# Patient Record
Sex: Male | Born: 2000 | Race: Black or African American | Hispanic: No | Marital: Single | Smoking: Never smoker
Health system: Southern US, Community
[De-identification: ages and names within clinical notes are randomized; demographics above are authoritative.]

## PROBLEM LIST (undated history)

## (undated) DIAGNOSIS — F909 Attention-deficit hyperactivity disorder, unspecified type: Secondary | ICD-10-CM

## (undated) DIAGNOSIS — Z8489 Family history of other specified conditions: Secondary | ICD-10-CM

## (undated) DIAGNOSIS — W3400XA Accidental discharge from unspecified firearms or gun, initial encounter: Secondary | ICD-10-CM

## (undated) DIAGNOSIS — S0990XA Unspecified injury of head, initial encounter: Secondary | ICD-10-CM

---
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
## 2019-07-19 ENCOUNTER — Ambulatory Visit: Payer: Medicaid Other

## 2019-11-09 ENCOUNTER — Other Ambulatory Visit: Payer: Self-pay

## 2019-11-09 DIAGNOSIS — Z20822 Contact with and (suspected) exposure to covid-19: Secondary | ICD-10-CM

## 2019-11-10 LAB — NOVEL CORONAVIRUS, NAA: SARS-CoV-2, NAA: NOT DETECTED

## 2020-01-06 ENCOUNTER — Encounter (HOSPITAL_COMMUNITY): Payer: Self-pay | Admitting: Emergency Medicine

## 2020-01-06 ENCOUNTER — Emergency Department (HOSPITAL_COMMUNITY)
Admission: EM | Admit: 2020-01-06 | Discharge: 2020-01-06 | Disposition: A | Discharge status: Home / Self Care | Payer: Medicaid Other | Attending: Emergency Medicine | Admitting: Emergency Medicine

## 2020-01-06 ENCOUNTER — Other Ambulatory Visit: Payer: Self-pay

## 2020-01-06 DIAGNOSIS — Z5321 Procedure and treatment not carried out due to patient leaving prior to being seen by health care provider: Secondary | ICD-10-CM | POA: Diagnosis not present

## 2020-01-06 DIAGNOSIS — K6289 Other specified diseases of anus and rectum: Secondary | ICD-10-CM | POA: Diagnosis present

## 2020-01-06 NOTE — ED Triage Notes (Signed)
Patient called for room, no answer. 

## 2020-01-06 NOTE — ED Triage Notes (Signed)
Pt reports abscess to perianal region. Pt denies n/v/fever.

## 2020-09-18 ENCOUNTER — Ambulatory Visit: Payer: Medicaid Other | Admitting: Family Medicine

## 2020-09-18 ENCOUNTER — Encounter: Payer: Self-pay | Admitting: Family Medicine

## 2020-09-18 ENCOUNTER — Other Ambulatory Visit: Payer: Self-pay

## 2020-09-18 DIAGNOSIS — Z113 Encounter for screening for infections with a predominantly sexual mode of transmission: Secondary | ICD-10-CM

## 2020-09-18 DIAGNOSIS — Z202 Contact with and (suspected) exposure to infections with a predominantly sexual mode of transmission: Secondary | ICD-10-CM | POA: Diagnosis not present

## 2020-09-18 LAB — GRAM STAIN

## 2020-09-18 MED ORDER — CEFTRIAXONE SODIUM 500 MG IJ SOLR
500.0000 mg | Freq: Once | INTRAMUSCULAR | Status: AC
  Administered 2020-09-18: 500 mg via INTRAMUSCULAR

## 2020-09-18 NOTE — Progress Notes (Signed)
Gram Stain results reviewed. Patient treated per standing orders for +Gonorrhea result. Tawny Hopping, RN

## 2020-09-18 NOTE — Progress Notes (Signed)
   [facility] Department STI clinic/screening visit  Subjective:  Cole Ferguson is a 19 y.o. male being seen today for an STI screening visit. The patient reports they do have symptoms.    Patient has the following medical conditions:  There are no problems to display for this patient.    Chief Complaint  Patient presents with  . SEXUALLY TRANSMITTED DISEASE    Screening  . Exposure to STD    Contact to Gonorrhea    HPI  Patient reports that he is a + contact to Cole Ferguson per his partner.  She has been treated.   He states that he has noticed small amount of yellowish discharge x 1 day.  Denies other symptoms.   See flowsheet for further details and programmatic requirements.    The following portions of the patient's history were reviewed and updated as appropriate: allergies, current medications, past medical history, past social history, past surgical history and problem list.  Objective:  There were no vitals filed for this visit.  Physical Exam Constitutional:      Appearance: Normal appearance.  HENT:     Head: Normocephalic and atraumatic.     Comments: No nits or hair loss    Mouth/Throat:     Mouth: Mucous membranes are moist.     Pharynx: Oropharynx is clear. No oropharyngeal exudate or posterior oropharyngeal erythema.  Pulmonary:     Effort: Pulmonary effort is normal.  Abdominal:     General: Abdomen is flat.     Palpations: Abdomen is soft. There is no hepatomegaly or mass.     Tenderness: There is no abdominal tenderness.  Genitourinary:    Pubic Area: No rash or pubic lice.      Penis: Normal.      Testes: Normal.     Epididymis:     Right: Normal.     Left: Normal.  Lymphadenopathy:     Head:     Right side of head: No preauricular or posterior auricular adenopathy.     Left side of head: No preauricular or posterior auricular adenopathy.     Cervical: No cervical adenopathy.     Upper Body:     Right upper body: No supraclavicular  or axillary adenopathy.     Left upper body: No supraclavicular or axillary adenopathy.     Lower Body: No right inguinal adenopathy.  Skin:    General: Skin is warm and dry.     Findings: No rash.  Neurological:     Mental Status: He is alert and oriented to person, place, and time.     Assessment and Plan:  [name] is a 19 y.o. male presenting to the [facility] Department for STI screening  1. Screening examination for venereal disease Declines bloodwork - Gram stain - Gonococcus culture  2. Gonorrhea contact  - cefTRIAXone (ROCEPHIN) injection 500 mg Co to abstain from sexual activity x 1 week and always use condoms for STD prevention.    No follow-ups on file.  No future appointments.  Larene Pickett, FNP

## 2020-11-06 NOTE — Addendum Note (Signed)
Addended by: Heywood Bene on: 11/06/2020 11:39 AM   Modules accepted: Orders

## 2021-05-26 ENCOUNTER — Encounter (HOSPITAL_COMMUNITY): Payer: Self-pay

## 2021-05-26 ENCOUNTER — Inpatient Hospital Stay (HOSPITAL_COMMUNITY): Payer: Medicaid Other

## 2021-05-26 ENCOUNTER — Inpatient Hospital Stay (HOSPITAL_COMMUNITY): Payer: Medicaid Other | Admitting: Registered Nurse

## 2021-05-26 ENCOUNTER — Encounter (HOSPITAL_COMMUNITY): Admission: EM | Disposition: A | Discharge status: Home Health | Payer: Self-pay | Source: Home / Self Care

## 2021-05-26 ENCOUNTER — Emergency Department (HOSPITAL_COMMUNITY): Payer: Medicaid Other

## 2021-05-26 ENCOUNTER — Other Ambulatory Visit: Payer: Self-pay

## 2021-05-26 ENCOUNTER — Ambulatory Visit: Payer: Self-pay | Admitting: Otolaryngology

## 2021-05-26 ENCOUNTER — Inpatient Hospital Stay (HOSPITAL_COMMUNITY)
Admission: EM | Admit: 2021-05-26 | Discharge: 2021-05-31 | DRG: 011 | Disposition: A | Discharge status: Home Health | Payer: Medicaid Other | Attending: General Surgery | Admitting: General Surgery

## 2021-05-26 DIAGNOSIS — S12300A Unspecified displaced fracture of fourth cervical vertebra, initial encounter for closed fracture: Secondary | ICD-10-CM | POA: Diagnosis present

## 2021-05-26 DIAGNOSIS — S1120XA Unspecified open wound of pharynx and cervical esophagus, initial encounter: Secondary | ICD-10-CM

## 2021-05-26 DIAGNOSIS — W3400XA Accidental discharge from unspecified firearms or gun, initial encounter: Secondary | ICD-10-CM

## 2021-05-26 DIAGNOSIS — J9601 Acute respiratory failure with hypoxia: Secondary | ICD-10-CM | POA: Diagnosis present

## 2021-05-26 DIAGNOSIS — T1490XA Injury, unspecified, initial encounter: Secondary | ICD-10-CM

## 2021-05-26 DIAGNOSIS — S11024A Puncture wound with foreign body of trachea, initial encounter: Principal | ICD-10-CM | POA: Diagnosis present

## 2021-05-26 DIAGNOSIS — Z20822 Contact with and (suspected) exposure to covid-19: Secondary | ICD-10-CM | POA: Diagnosis present

## 2021-05-26 DIAGNOSIS — J9690 Respiratory failure, unspecified, unspecified whether with hypoxia or hypercapnia: Secondary | ICD-10-CM | POA: Diagnosis present

## 2021-05-26 DIAGNOSIS — F909 Attention-deficit hyperactivity disorder, unspecified type: Secondary | ICD-10-CM | POA: Diagnosis present

## 2021-05-26 DIAGNOSIS — Y249XXA Unspecified firearm discharge, undetermined intent, initial encounter: Secondary | ICD-10-CM

## 2021-05-26 DIAGNOSIS — R1310 Dysphagia, unspecified: Secondary | ICD-10-CM | POA: Diagnosis present

## 2021-05-26 DIAGNOSIS — Y9289 Other specified places as the place of occurrence of the external cause: Secondary | ICD-10-CM

## 2021-05-26 HISTORY — DX: Attention-deficit hyperactivity disorder, unspecified type: F90.9

## 2021-05-26 HISTORY — PX: TRACHEOSTOMY TUBE PLACEMENT: SHX814

## 2021-05-26 LAB — COMPREHENSIVE METABOLIC PANEL
ALT: 24 U/L (ref 0–44)
AST: 24 U/L (ref 15–41)
Albumin: 4.2 g/dL (ref 3.5–5.0)
Alkaline Phosphatase: 55 U/L (ref 38–126)
Anion gap: 11 (ref 5–15)
BUN: 12 mg/dL (ref 6–20)
CO2: 24 mmol/L (ref 22–32)
Calcium: 9.2 mg/dL (ref 8.9–10.3)
Chloride: 101 mmol/L (ref 98–111)
Creatinine, Ser: 1.1 mg/dL (ref 0.61–1.24)
GFR, Estimated: >60 mL/min (ref >60)
Glucose, Bld: 107 mg/dL — ABNORMAL HIGH (ref 70–99)
Potassium: 3.7 mmol/L (ref 3.5–5.1)
Sodium: 136 mmol/L (ref 135–145)
Total Bilirubin: 1.4 mg/dL — ABNORMAL HIGH (ref 0.3–1.2)
Total Protein: 6.5 g/dL (ref 6.5–8.1)

## 2021-05-26 LAB — I-STAT ARTERIAL BLOOD GAS, ED
Acid-Base Excess: 2 mmol/L (ref 0.0–2.0)
Bicarbonate: 27 mmol/L (ref 20.0–28.0)
Calcium, Ion: 1.21 mmol/L (ref 1.15–1.40)
HCT: 44 % (ref 39.0–52.0)
Hemoglobin: 15 g/dL (ref 13.0–17.0)
O2 Saturation: 100 %
Patient temperature: 97
Potassium: 3.3 mmol/L — ABNORMAL LOW (ref 3.5–5.1)
Sodium: 137 mmol/L (ref 135–145)
TCO2: 28 mmol/L (ref 22–32)
pCO2 arterial: 41.8 mmHg (ref 32.0–48.0)
pH, Arterial: 7.414 (ref 7.350–7.450)
pO2, Arterial: 516 mmHg — ABNORMAL HIGH (ref 83.0–108.0)

## 2021-05-26 LAB — CBC
HCT: 44.7 % (ref 39.0–52.0)
Hemoglobin: 15 g/dL (ref 13.0–17.0)
MCH: 28.8 pg (ref 26.0–34.0)
MCHC: 33.6 g/dL (ref 30.0–36.0)
MCV: 86 fL (ref 80.0–100.0)
Platelets: 128 10*3/uL — ABNORMAL LOW (ref 150–400)
RBC: 5.2 MIL/uL (ref 4.22–5.81)
RDW: 13.3 % (ref 11.5–15.5)
WBC: 8.5 10*3/uL (ref 4.0–10.5)
nRBC: 0 % (ref 0.0–0.2)

## 2021-05-26 LAB — ETHANOL: Alcohol, Ethyl (B): 61 mg/dL — ABNORMAL HIGH (ref <10)

## 2021-05-26 LAB — URINALYSIS, ROUTINE W REFLEX MICROSCOPIC
Bilirubin Urine: NEGATIVE
Glucose, UA: NEGATIVE mg/dL
Hgb urine dipstick: NEGATIVE
Ketones, ur: 5 mg/dL — AB
Leukocytes,Ua: NEGATIVE
Nitrite: NEGATIVE
Protein, ur: NEGATIVE mg/dL
Specific Gravity, Urine: 1.035 — ABNORMAL HIGH (ref 1.005–1.030)
pH: 6 (ref 5.0–8.0)

## 2021-05-26 LAB — SAMPLE TO BLOOD BANK

## 2021-05-26 LAB — LACTIC ACID, PLASMA: Lactic Acid, Venous: 2.4 mmol/L (ref 0.5–1.9)

## 2021-05-26 LAB — I-STAT CHEM 8, ED
BUN: 14 mg/dL (ref 6–20)
Calcium, Ion: 1.14 mmol/L — ABNORMAL LOW (ref 1.15–1.40)
Chloride: 101 mmol/L (ref 98–111)
Creatinine, Ser: 1.2 mg/dL (ref 0.61–1.24)
Glucose, Bld: 107 mg/dL — ABNORMAL HIGH (ref 70–99)
HCT: 46 % (ref 39.0–52.0)
Hemoglobin: 15.6 g/dL (ref 13.0–17.0)
Potassium: 3.2 mmol/L — ABNORMAL LOW (ref 3.5–5.1)
Sodium: 140 mmol/L (ref 135–145)
TCO2: 24 mmol/L (ref 22–32)

## 2021-05-26 LAB — MRSA PCR SCREENING: MRSA by PCR: NEGATIVE

## 2021-05-26 LAB — PROTIME-INR
INR: 1.2 (ref 0.8–1.2)
Prothrombin Time: 15.2 seconds (ref 11.4–15.2)

## 2021-05-26 LAB — RESP PANEL BY RT-PCR (FLU A&B, COVID) ARPGX2
Influenza A by PCR: NEGATIVE
Influenza B by PCR: NEGATIVE
SARS Coronavirus 2 by RT PCR: NEGATIVE

## 2021-05-26 LAB — HIV ANTIBODY (ROUTINE TESTING W REFLEX): HIV Screen 4th Generation wRfx: NONREACTIVE

## 2021-05-26 SURGERY — CREATION, TRACHEOSTOMY
Anesthesia: General

## 2021-05-26 MED ORDER — METOPROLOL TARTRATE 5 MG/5ML IV SOLN: 5.0000 mg | Freq: Four times a day (QID) | INTRAVENOUS | Status: DC | PRN

## 2021-05-26 MED ORDER — CHLORHEXIDINE GLUCONATE CLOTH 2 % EX PADS
6.0000 | MEDICATED_PAD | Freq: Every day | CUTANEOUS | Status: DC
  Administered 2021-05-26 – 2021-05-31 (×6): 6 via TOPICAL

## 2021-05-26 MED ORDER — LORAZEPAM 2 MG/ML IJ SOLN: 1.0000 mg | INTRAMUSCULAR | Status: DC | PRN

## 2021-05-26 MED ORDER — ONDANSETRON HCL 4 MG/2ML IJ SOLN: 4.0000 mg | Freq: Four times a day (QID) | INTRAMUSCULAR | Status: DC | PRN

## 2021-05-26 MED ORDER — FENTANYL BOLUS VIA INFUSION
50.0000 ug | INTRAVENOUS | Status: DC | PRN
  Administered 2021-05-26: 100 ug via INTRAVENOUS
  Filled 2021-05-26: qty 100

## 2021-05-26 MED ORDER — MORPHINE SULFATE 1 MG/ML IV SOLN PCA
INTRAVENOUS | Status: DC
  Administered 2021-05-26 (×2): 4.5 mg via INTRAVENOUS
  Administered 2021-05-26: 10.5 mg via INTRAVENOUS
  Administered 2021-05-26: 13.36 mg via INTRAVENOUS
  Administered 2021-05-27: 11 mg via INTRAVENOUS
  Administered 2021-05-27: 6.89 mg via INTRAVENOUS
  Administered 2021-05-27: 0 mg via INTRAVENOUS
  Administered 2021-05-28: 12 mg via INTRAVENOUS
  Filled 2021-05-26 (×6): qty 30

## 2021-05-26 MED ORDER — PANTOPRAZOLE SODIUM 40 MG IV SOLR
40.0000 mg | Freq: Every day | INTRAVENOUS | Status: DC
  Administered 2021-05-26 – 2021-05-28 (×3): 40 mg via INTRAVENOUS
  Filled 2021-05-26 (×3): qty 40

## 2021-05-26 MED ORDER — DIPHENHYDRAMINE HCL 50 MG/ML IJ SOLN
12.5000 mg | Freq: Four times a day (QID) | INTRAMUSCULAR | Status: DC | PRN
  Administered 2021-05-29: 25 mg via INTRAVENOUS
  Filled 2021-05-26: qty 1

## 2021-05-26 MED ORDER — POLYETHYLENE GLYCOL 3350 17 G PO PACK
17.0000 g | PACK | Freq: Every day | ORAL | Status: DC
  Administered 2021-05-28 – 2021-05-31 (×4): 17 g
  Filled 2021-05-26 (×4): qty 1

## 2021-05-26 MED ORDER — DOCUSATE SODIUM 50 MG/5ML PO LIQD: 100.0000 mg | Freq: Two times a day (BID) | ORAL | Status: DC

## 2021-05-26 MED ORDER — ORAL CARE MOUTH RINSE
15.0000 mL | OROMUCOSAL | Status: DC
  Administered 2021-05-26 – 2021-05-31 (×35): 15 mL via OROMUCOSAL

## 2021-05-26 MED ORDER — FENTANYL CITRATE (PF) 250 MCG/5ML IJ SOLN
INTRAMUSCULAR | Status: AC
  Filled 2021-05-26: qty 5

## 2021-05-26 MED ORDER — IOHEXOL 350 MG/ML SOLN
75.0000 mL | Freq: Once | INTRAVENOUS | Status: AC | PRN
  Administered 2021-05-26: 75 mL via INTRAVENOUS

## 2021-05-26 MED ORDER — LACTATED RINGERS IV SOLN: INTRAVENOUS | Status: DC | PRN

## 2021-05-26 MED ORDER — ETOMIDATE 2 MG/ML IV SOLN
INTRAVENOUS | Status: AC | PRN
  Administered 2021-05-26: 30 mg via INTRAVENOUS

## 2021-05-26 MED ORDER — FENTANYL CITRATE (PF) 250 MCG/5ML IJ SOLN
INTRAMUSCULAR | Status: DC | PRN
  Administered 2021-05-26 (×2): 100 ug via INTRAVENOUS
  Administered 2021-05-26: 50 ug via INTRAVENOUS

## 2021-05-26 MED ORDER — MORPHINE SULFATE (PF) 2 MG/ML IV SOLN
2.0000 mg | INTRAVENOUS | Status: AC | PRN
  Administered 2021-05-26: 2 mg via INTRAVENOUS
  Administered 2021-05-26: 4 mg via INTRAVENOUS
  Filled 2021-05-26: qty 2
  Filled 2021-05-26: qty 1

## 2021-05-26 MED ORDER — ROCURONIUM BROMIDE 100 MG/10ML IV SOLN
INTRAVENOUS | Status: DC | PRN
  Administered 2021-05-26: 40 mg via INTRAVENOUS

## 2021-05-26 MED ORDER — PROPOFOL 500 MG/50ML IV EMUL
INTRAVENOUS | Status: DC | PRN
  Administered 2021-05-26: 100 ug/kg/min via INTRAVENOUS

## 2021-05-26 MED ORDER — FENTANYL CITRATE (PF) 100 MCG/2ML IJ SOLN: 50.0000 ug | Freq: Once | INTRAMUSCULAR | Status: DC

## 2021-05-26 MED ORDER — ROCURONIUM BROMIDE 50 MG/5ML IV SOLN
INTRAVENOUS | Status: AC | PRN
  Administered 2021-05-26: 80 mg via INTRAVENOUS

## 2021-05-26 MED ORDER — PROPOFOL 1000 MG/100ML IV EMUL
INTRAVENOUS | Status: AC | PRN
  Administered 2021-05-26: 10 ug/kg/min via INTRAVENOUS

## 2021-05-26 MED ORDER — FENTANYL 2500MCG IN NS 250ML (10MCG/ML) PREMIX INFUSION
50.0000 ug/h | INTRAVENOUS | Status: DC
  Administered 2021-05-26: 50 ug/h via INTRAVENOUS
  Filled 2021-05-26: qty 250

## 2021-05-26 MED ORDER — SODIUM CHLORIDE 0.9% FLUSH: 9.0000 mL | INTRAVENOUS | Status: DC | PRN

## 2021-05-26 MED ORDER — EPHEDRINE SULFATE-NACL 50-0.9 MG/10ML-% IV SOSY
PREFILLED_SYRINGE | INTRAVENOUS | Status: DC | PRN
  Administered 2021-05-26: 5 mg via INTRAVENOUS

## 2021-05-26 MED ORDER — CHLORHEXIDINE GLUCONATE 0.12% ORAL RINSE (MEDLINE KIT)
15.0000 mL | Freq: Two times a day (BID) | OROMUCOSAL | Status: DC
  Administered 2021-05-26 – 2021-05-31 (×10): 15 mL via OROMUCOSAL

## 2021-05-26 MED ORDER — PROPOFOL 1000 MG/100ML IV EMUL
0.0000 ug/kg/min | INTRAVENOUS | Status: DC
  Filled 2021-05-26: qty 100

## 2021-05-26 MED ORDER — SODIUM CHLORIDE 0.9 % IV SOLN
3.0000 g | Freq: Four times a day (QID) | INTRAVENOUS | Status: DC
  Administered 2021-05-26 – 2021-05-31 (×19): 3 g via INTRAVENOUS
  Filled 2021-05-26: qty 8
  Filled 2021-05-26: qty 3
  Filled 2021-05-26 (×3): qty 8
  Filled 2021-05-26 (×2): qty 3
  Filled 2021-05-26: qty 8
  Filled 2021-05-26: qty 3
  Filled 2021-05-26: qty 8
  Filled 2021-05-26: qty 3
  Filled 2021-05-26 (×6): qty 8
  Filled 2021-05-26: qty 3
  Filled 2021-05-26 (×2): qty 8
  Filled 2021-05-26 (×2): qty 3
  Filled 2021-05-26: qty 8
  Filled 2021-05-26 (×2): qty 3
  Filled 2021-05-26 (×4): qty 8
  Filled 2021-05-26: qty 3

## 2021-05-26 MED ORDER — LACTATED RINGERS IV SOLN: INTRAVENOUS | Status: DC

## 2021-05-26 MED ORDER — PROPOFOL 1000 MG/100ML IV EMUL
INTRAVENOUS | Status: AC
  Filled 2021-05-26: qty 100

## 2021-05-26 MED ORDER — PANTOPRAZOLE SODIUM 40 MG PO TBEC
40.0000 mg | DELAYED_RELEASE_TABLET | Freq: Every day | ORAL | Status: DC
  Administered 2021-05-29: 40 mg via ORAL
  Filled 2021-05-26: qty 1

## 2021-05-26 MED ORDER — NALOXONE HCL 0.4 MG/ML IJ SOLN: 0.4000 mg | INTRAMUSCULAR | Status: DC | PRN

## 2021-05-26 MED ORDER — FENTANYL CITRATE (PF) 100 MCG/2ML IJ SOLN
50.0000 ug | Freq: Once | INTRAMUSCULAR | Status: AC
  Administered 2021-05-26: 50 ug via INTRAVENOUS

## 2021-05-26 MED ORDER — FENTANYL CITRATE (PF) 100 MCG/2ML IJ SOLN
INTRAMUSCULAR | Status: AC
  Filled 2021-05-26: qty 2

## 2021-05-26 MED ORDER — MIDAZOLAM HCL 2 MG/2ML IJ SOLN
INTRAMUSCULAR | Status: AC
  Filled 2021-05-26: qty 2

## 2021-05-26 MED ORDER — ONDANSETRON 4 MG PO TBDP: 4.0000 mg | ORAL_TABLET | Freq: Four times a day (QID) | ORAL | Status: DC | PRN

## 2021-05-26 MED ORDER — PROPOFOL 1000 MG/100ML IV EMUL
0.0000 ug/kg/min | INTRAVENOUS | Status: DC
  Administered 2021-05-26: 50 ug/kg/min via INTRAVENOUS
  Filled 2021-05-26: qty 100

## 2021-05-26 MED ORDER — DIPHENHYDRAMINE HCL 12.5 MG/5ML PO ELIX
12.5000 mg | ORAL_SOLUTION | Freq: Four times a day (QID) | ORAL | Status: DC | PRN
  Administered 2021-05-29: 12.5 mg via ORAL
  Filled 2021-05-26 (×2): qty 5

## 2021-05-26 MED ORDER — METOPROLOL TARTRATE 5 MG/5ML IV SOLN
5.0000 mg | Freq: Four times a day (QID) | INTRAVENOUS | Status: DC | PRN
  Administered 2021-05-26 (×2): 5 mg via INTRAVENOUS
  Filled 2021-05-26 (×3): qty 5

## 2021-05-26 MED ORDER — PROPOFOL 10 MG/ML IV BOLUS
INTRAVENOUS | Status: AC
  Filled 2021-05-26: qty 20

## 2021-05-26 MED ORDER — ONDANSETRON HCL 4 MG/2ML IJ SOLN
INTRAMUSCULAR | Status: DC | PRN
  Administered 2021-05-26: 4 mg via INTRAVENOUS

## 2021-05-26 SURGICAL SUPPLY — 25 items
BNDG CONFORM 2 STRL LF (GAUZE/BANDAGES/DRESSINGS) ×2 IMPLANT
CANISTER SUCT 3000ML PPV (MISCELLANEOUS) ×2 IMPLANT
CLEANER TIP ELECTROSURG 2X2 (MISCELLANEOUS) ×2 IMPLANT
CONT SPEC 4OZ CLIKSEAL STRL BL (MISCELLANEOUS) ×2 IMPLANT
COVER SURGICAL LIGHT HANDLE (MISCELLANEOUS) ×2 IMPLANT
ELECT COATED BLADE 2.86 ST (ELECTRODE) ×2 IMPLANT
ELECT REM PT RETURN 9FT ADLT (ELECTROSURGICAL) ×2
ELECTRODE REM PT RTRN 9FT ADLT (ELECTROSURGICAL) ×1 IMPLANT
GAUZE 4X4 16PLY RFD (DISPOSABLE) ×2 IMPLANT
GLOVE ECLIPSE 7.5 STRL STRAW (GLOVE) ×2 IMPLANT
GOWN STRL REUS W/ TWL LRG LVL3 (GOWN DISPOSABLE) ×2 IMPLANT
GOWN STRL REUS W/TWL LRG LVL3 (GOWN DISPOSABLE) ×4
KIT BASIN OR (CUSTOM PROCEDURE TRAY) ×2 IMPLANT
KIT TURNOVER KIT B (KITS) ×2 IMPLANT
NS IRRIG 1000ML POUR BTL (IV SOLUTION) ×2 IMPLANT
PAD ARMBOARD 7.5X6 YLW CONV (MISCELLANEOUS) ×4 IMPLANT
PENCIL FOOT CONTROL (ELECTRODE) ×2 IMPLANT
SUT CHROMIC 2 0 SH (SUTURE) ×2 IMPLANT
SUT SILK 2 0 (SUTURE) ×2
SUT SILK 2-0 18XBRD TIE 12 (SUTURE) ×1 IMPLANT
SUT SILK 4 0 TIE 10X30 (SUTURE) ×2 IMPLANT
TOWEL GREEN STERILE FF (TOWEL DISPOSABLE) ×2 IMPLANT
TRAY ENT MC OR (CUSTOM PROCEDURE TRAY) ×2 IMPLANT
TUBE CONNECTING 12X1/4 (SUCTIONS) ×2 IMPLANT
TUBE TRACH SHILEY 8 FEN (TUBING) ×2 IMPLANT

## 2021-05-26 NOTE — Progress Notes (Signed)
   05/26/21 0135  Clinical Encounter Type  Visited With Patient not available  Visit Type Trauma  Referral From Nurse  Consult/Referral To Chaplain  Chaplain responded to level 1 page. The patient is being treated by the medical team. The patient's family is currently not present. The chaplain remains available. This note was prepared by Deneen Harts, M.Div..  For questions please contact by phone [phone].

## 2021-05-26 NOTE — ED Notes (Signed)
pts HOB raised to 25-30 degrees

## 2021-05-26 NOTE — Transfer of Care (Signed)
Immediate Anesthesia Transfer of Care Note  Patient: Cole Ferguson  Procedure(s) Performed: TRACHEOSTOMY, LARYNGOSCOPY, ESOPHAGOSCOPY (N/A )  Patient Location: ICU  Anesthesia Type:General  Level of Consciousness:trach placed by Pollyann Kennedy MD, propofol gtt maintained for sedation  Airway & Oxygen Therapy: Patient remains intubated per anesthesia plan and Patient placed on Ventilator (see vital sign flow sheet for setting) - trach placed by Pollyann Kennedy MD  Post-op Assessment: Report given to RN and Post -op Vital signs reviewed and stable  Post vital signs: Reviewed and stable  Last Vitals:  Vitals Value Taken Time  BP 129/75 05/26/21 0449  Temp    Pulse 86 05/26/21 0453  Resp 19 05/26/21 0453  SpO2 97 % 05/26/21 0453  Vitals shown include unvalidated device data.  Last Pain:  Vitals:   05/26/21 0151  PainSc: 10-Worst pain ever       Report to [facility], propofol gtt maintained, RT at bedside, applied to vent, VSS, transfer of patient in safe and stable condition.  Complications: No complications documented.

## 2021-05-26 NOTE — ED Notes (Signed)
Soft wrist restraints pulled from pyxis and placed on pts wrist as pt kept placing his hands on the ETT tube, messing with it and wanting it out. Pt was redirectable though and the restraints were never secured to the bed. Pt kept full ROM in his arms and hands as RN never secured the restraints to the bed.
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
## 2021-05-26 NOTE — Progress Notes (Signed)
RT note. Patient transported to CT and back without any complications. 

## 2021-05-26 NOTE — Progress Notes (Signed)
Patient's belongings sent home with mother, Morrie Sheldon.

## 2021-05-26 NOTE — Progress Notes (Signed)
Patient seen and examined. Following commands. Transitioned to PSV, tolerating. Trach collar later today and stay off vent as long as tolerating (RR<35). No enteral access. D/w Dr. Pollyann Kennedy and recommends no OP attempts at access due to high probability of creating a false passage. Will plan for open g-tube 6/6. Informed consent was obtained after detailed explanation of risks, including bleeding, infection, malposition. All questions answered to the family's satisfaction. Informed consent obtained from both parents.   Critical care time:  Diamantina Monks, MD General and Trauma Surgery [facility]
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
## 2021-05-26 NOTE — Progress Notes (Signed)
Patient ID: Cole Ferguson, male   DOB: 02/12/2001, 21 y.o.   MRN: [id number] Contacted about patients left arm being weaker than it had been this afternoon. After review of films ordered mri of neck which shows no intrinsic cord or nerve root damage. Nurses report that patient says arm feels better and better strength noted. Will continue to monitor

## 2021-05-26 NOTE — ED Notes (Signed)
Dr. Janee Morn just spoke with ENT and is calling neurosurgery at this time.
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
## 2021-05-26 NOTE — ED Notes (Signed)
.  Transition of Care [facility]) - CAGE-AID Screening   Patient Details  Name: Cole Ferguson MRN: [id number] Date of Birth: 12/22/1876   Clinical Narrative:  Pt is intubated, unable to answer questions  CAGE-AID Screening: Substance Abuse Screening unable to be completed due to: : Patient unable to participate

## 2021-05-26 NOTE — ED Notes (Signed)
Preparing for intubation
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
## 2021-05-26 NOTE — Progress Notes (Addendum)
Pt. Called out with report of right arm numbness. Decreased sensation of right arm on assessment. Normal sensation in all other extremities. Decreased grip with right hand. Normal strength in all other extremities.   Notified Dr. Danielle Dess of findings, MRI ordered, will obtain.   Renold Don, RN
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
## 2021-05-26 NOTE — ED Notes (Signed)
Per trauma MD, no OG tube to be placed at this time

## 2021-05-26 NOTE — Anesthesia Postprocedure Evaluation (Signed)
Anesthesia Post Note  Patient: Cole Ferguson  Procedure(s) Performed: TRACHEOSTOMY, LARYNGOSCOPY, ESOPHAGOSCOPY (N/A )     Patient location during evaluation: SICU Anesthesia Type: General Level of consciousness: sedated Pain management: pain level controlled Vital Signs Assessment: post-procedure vital signs reviewed and stable Respiratory status: patient remains intubated per anesthesia plan Cardiovascular status: stable Postop Assessment: no apparent nausea or vomiting Anesthetic complications: no   No complications documented.  Last Vitals:  Vitals:   05/26/21 0245 05/26/21 0450  BP: (!) 149/88 129/75  Pulse: 96 83  Resp: (!) 21 20  Temp:  36.4 C  SpO2: 99% 99%    Last Pain:  Vitals:   05/26/21 0450  TempSrc: Oral  PainSc:                  Cole Ferguson
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
## 2021-05-26 NOTE — Anesthesia Preprocedure Evaluation (Signed)
Anesthesia Evaluation  Patient identified by MRN, date of birth, ID band Patient awake    Reviewed: Unable to perform ROS - Chart review onlyPreop documentation limited or incomplete due to emergent nature of procedure.  Airway Mallampati: Intubated       Dental no notable dental hx.    Pulmonary     + decreased breath sounds      Cardiovascular Normal cardiovascular exam     Neuro/Psych    GI/Hepatic   Endo/Other    Renal/GU      Musculoskeletal   Abdominal Normal abdominal exam  (+)   Peds  Hematology   Anesthesia Other Findings   Reproductive/Obstetrics                             Anesthesia Physical Anesthesia Plan  ASA: III and emergent  Anesthesia Plan: General   Post-op Pain Management:    Induction: Inhalational  PONV Risk Score and Plan: 2 and Ondansetron and Midazolam  Airway Management Planned: Oral ETT  Additional Equipment: None  Intra-op Plan:   Post-operative Plan: Post-operative intubation/ventilation  Informed Consent:     History available from chart only and Only emergency history available  Plan Discussed with: CRNA  Anesthesia Plan Comments:         Anesthesia Quick Evaluation
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
## 2021-05-27 ENCOUNTER — Encounter (HOSPITAL_COMMUNITY): Payer: Self-pay | Admitting: General Surgery

## 2021-05-27 ENCOUNTER — Inpatient Hospital Stay (HOSPITAL_COMMUNITY): Payer: Medicaid Other | Admitting: Anesthesiology

## 2021-05-27 ENCOUNTER — Encounter (HOSPITAL_COMMUNITY): Admission: EM | Disposition: A | Discharge status: Home Health | Payer: Self-pay | Source: Home / Self Care

## 2021-05-27 ENCOUNTER — Inpatient Hospital Stay (HOSPITAL_COMMUNITY): Payer: Medicaid Other

## 2021-05-27 HISTORY — PX: GASTROSTOMY: SHX5249

## 2021-05-27 LAB — BASIC METABOLIC PANEL
Anion gap: 9 (ref 5–15)
BUN: 8 mg/dL (ref 6–20)
CO2: 27 mmol/L (ref 22–32)
Calcium: 9 mg/dL (ref 8.9–10.3)
Chloride: 101 mmol/L (ref 98–111)
Creatinine, Ser: 0.9 mg/dL (ref 0.61–1.24)
GFR, Estimated: >60 mL/min (ref >60)
Glucose, Bld: 90 mg/dL (ref 70–99)
Potassium: 3.7 mmol/L (ref 3.5–5.1)
Sodium: 137 mmol/L (ref 135–145)

## 2021-05-27 LAB — PHOSPHORUS: Phosphorus: 3.6 mg/dL (ref 2.5–4.6)

## 2021-05-27 LAB — MAGNESIUM: Magnesium: 1.8 mg/dL (ref 1.7–2.4)

## 2021-05-27 SURGERY — INSERTION OF GASTROSTOMY TUBE
Anesthesia: General | Site: Abdomen

## 2021-05-27 MED ORDER — BUPIVACAINE HCL 0.25 % IJ SOLN
INTRAMUSCULAR | Status: DC | PRN
  Administered 2021-05-27: 30 mL

## 2021-05-27 MED ORDER — PROPOFOL 10 MG/ML IV BOLUS
INTRAVENOUS | Status: DC | PRN
  Administered 2021-05-27: 100 mg via INTRAVENOUS

## 2021-05-27 MED ORDER — ONDANSETRON HCL 4 MG/2ML IJ SOLN
INTRAMUSCULAR | Status: DC | PRN
  Administered 2021-05-27: 4 mg via INTRAVENOUS

## 2021-05-27 MED ORDER — ROCURONIUM BROMIDE 10 MG/ML (PF) SYRINGE
PREFILLED_SYRINGE | INTRAVENOUS | Status: DC | PRN
  Administered 2021-05-27: 50 mg via INTRAVENOUS

## 2021-05-27 MED ORDER — FENTANYL CITRATE (PF) 250 MCG/5ML IJ SOLN
INTRAMUSCULAR | Status: DC | PRN
  Administered 2021-05-27: 100 ug via INTRAVENOUS
  Administered 2021-05-27 (×2): 50 ug via INTRAVENOUS

## 2021-05-27 MED ORDER — PIVOT 1.5 CAL PO LIQD
1000.0000 mL | ORAL | Status: DC
  Administered 2021-05-27 – 2021-05-28 (×2): 1000 mL
  Filled 2021-05-27 (×2): qty 1000

## 2021-05-27 MED ORDER — 0.9 % SODIUM CHLORIDE (POUR BTL) OPTIME
TOPICAL | Status: DC | PRN
  Administered 2021-05-27: 1000 mL

## 2021-05-27 MED ORDER — MIDAZOLAM HCL 5 MG/5ML IJ SOLN
INTRAMUSCULAR | Status: DC | PRN
  Administered 2021-05-27: 2 mg via INTRAVENOUS

## 2021-05-27 MED ORDER — SUGAMMADEX SODIUM 200 MG/2ML IV SOLN
INTRAVENOUS | Status: DC | PRN
  Administered 2021-05-27: 200 mg via INTRAVENOUS

## 2021-05-27 MED ORDER — FENTANYL CITRATE (PF) 250 MCG/5ML IJ SOLN
INTRAMUSCULAR | Status: AC
  Filled 2021-05-27: qty 5

## 2021-05-27 MED ORDER — MIDAZOLAM HCL 2 MG/2ML IJ SOLN
INTRAMUSCULAR | Status: AC
  Filled 2021-05-27: qty 2

## 2021-05-27 MED ORDER — LACTATED RINGERS IV SOLN: INTRAVENOUS | Status: DC | PRN

## 2021-05-27 MED ORDER — PROPOFOL 10 MG/ML IV BOLUS
INTRAVENOUS | Status: AC
  Filled 2021-05-27: qty 40

## 2021-05-27 MED ORDER — DEXAMETHASONE SODIUM PHOSPHATE 10 MG/ML IJ SOLN
INTRAMUSCULAR | Status: AC
  Filled 2021-05-27: qty 1

## 2021-05-27 MED ORDER — DEXAMETHASONE SODIUM PHOSPHATE 10 MG/ML IJ SOLN
INTRAMUSCULAR | Status: DC | PRN
  Administered 2021-05-27: 4 mg via INTRAVENOUS

## 2021-05-27 MED ORDER — ONDANSETRON HCL 4 MG/2ML IJ SOLN
INTRAMUSCULAR | Status: AC
  Filled 2021-05-27: qty 2

## 2021-05-27 SURGICAL SUPPLY — 42 items
CANISTER SUCT 3000ML PPV (MISCELLANEOUS) ×2 IMPLANT
CATH GASTROSTOMY 20FR (CATHETERS) ×2 IMPLANT
CELLS DAT CNTRL 66122 CELL SVR (MISCELLANEOUS) IMPLANT
CHLORAPREP W/TINT 26 (MISCELLANEOUS) ×2 IMPLANT
COVER SURGICAL LIGHT HANDLE (MISCELLANEOUS) ×2 IMPLANT
COVER WAND RF STERILE (DRAPES) ×2 IMPLANT
DRAPE LAPAROSCOPIC ABDOMINAL (DRAPES) ×2 IMPLANT
DRSG TELFA 3X8 NADH (GAUZE/BANDAGES/DRESSINGS) IMPLANT
ELECT CAUTERY BLADE 6.4 (BLADE) ×2 IMPLANT
ELECT REM PT RETURN 9FT ADLT (ELECTROSURGICAL) ×2
ELECTRODE REM PT RTRN 9FT ADLT (ELECTROSURGICAL) ×1 IMPLANT
GAUZE SPONGE 4X4 12PLY STRL (GAUZE/BANDAGES/DRESSINGS) ×2 IMPLANT
GLOVE BIO SURGEON STRL SZ 6.5 (GLOVE) ×2 IMPLANT
GLOVE BIOGEL PI IND STRL 6 (GLOVE) ×1 IMPLANT
GLOVE BIOGEL PI INDICATOR 6 (GLOVE) ×1
GOWN STRL REUS W/ TWL LRG LVL3 (GOWN DISPOSABLE) ×1 IMPLANT
GOWN STRL REUS W/TWL LRG LVL3 (GOWN DISPOSABLE) ×2
HANDLE SUCTION POOLE (INSTRUMENTS) IMPLANT
KIT BASIN OR (CUSTOM PROCEDURE TRAY) ×2 IMPLANT
KIT TUBE JEJUNAL 16FR (CATHETERS) IMPLANT
KIT TURNOVER KIT B (KITS) ×2 IMPLANT
NS IRRIG 1000ML POUR BTL (IV SOLUTION) ×2 IMPLANT
PACK GENERAL/GYN (CUSTOM PROCEDURE TRAY) ×2 IMPLANT
PAD ARMBOARD 7.5X6 YLW CONV (MISCELLANEOUS) ×4 IMPLANT
PENCIL SMOKE EVACUATOR (MISCELLANEOUS) IMPLANT
PLUG CATH AND CAP STER (CATHETERS) IMPLANT
RTRCTR WOUND ALEXIS 18CM MED (MISCELLANEOUS)
RTRCTR WOUND ALEXIS 18CM SML (INSTRUMENTS)
SAVER CELL AAL HAEMONETICS (INSTRUMENTS) IMPLANT
SPONGE DRAIN TRACH 4X4 STRL 2S (GAUZE/BANDAGES/DRESSINGS) IMPLANT
SUCTION POOLE HANDLE (INSTRUMENTS)
SUT MNCRL AB 4-0 PS2 18 (SUTURE) ×2 IMPLANT
SUT PDS AB 1 CT  36 (SUTURE) ×2
SUT PDS AB 1 CT 36 (SUTURE) ×1 IMPLANT
SUT PROLENE 2 0 CT2 30 (SUTURE) IMPLANT
SUT SILK 2 0 SH (SUTURE) ×4 IMPLANT
SUT SILK 2 0 SH CR/8 (SUTURE) ×2 IMPLANT
SYR 10ML LL (SYRINGE) ×2 IMPLANT
SYRINGE TOOMEY DISP (SYRINGE) ×2 IMPLANT
TOWEL GREEN STERILE (TOWEL DISPOSABLE) ×2 IMPLANT
TOWEL GREEN STERILE FF (TOWEL DISPOSABLE) ×2 IMPLANT
TUBE J 18FR (TUBING) IMPLANT
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
## 2021-05-27 NOTE — Anesthesia Procedure Notes (Addendum)
Date/Time: 05/27/2021 7:59 AM Performed by: Waynard Sliva, CRNA Pre-anesthesia Checklist: Patient identified, Emergency Drugs available, Suction available and Patient being monitored Patient Re-evaluated:Patient Re-evaluated prior to induction Oxygen Delivery Method: Circle system utilized Preoxygenation: Pre-oxygenation with 100% oxygen Induction Type: IV induction and Inhalational induction with existing ETT Placement Confirmation: positive ETCO2 and breath sounds checked- equal and bilateral Dental Injury: Teeth and Oropharynx as per pre-operative assessment
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
## 2021-05-27 NOTE — Anesthesia Postprocedure Evaluation (Signed)
Anesthesia Post Note  Patient: Cole Ferguson  Procedure(s) Performed: INSERTION OF GASTROSTOMY TUBE (N/A Abdomen)     Patient location during evaluation: PACU Anesthesia Type: General Level of consciousness: awake Pain management: pain level controlled Vital Signs Assessment: post-procedure vital signs reviewed and stable Respiratory status: spontaneous breathing, nonlabored ventilation, respiratory function stable and patient connected to T-piece oxygen Cardiovascular status: blood pressure returned to baseline and stable Postop Assessment: no apparent nausea or vomiting Anesthetic complications: no   No complications documented.  Last Vitals:  Vitals:   05/27/21 1957 05/27/21 2000  BP: (!) 168/97 (!) 162/95  Pulse: 92 85  Resp: 17 16  Temp:  37.1 C  SpO2: 97% 100%    Last Pain:  Vitals:   05/27/21 2000  TempSrc: Oral  PainSc: 7                  Arne Schlender P Jayziah Bankhead

## 2021-05-27 NOTE — Anesthesia Postprocedure Evaluation (Signed)
Anesthesia Post Note  Patient: Cole Ferguson  Procedure(s) Performed: INSERTION OF GASTROSTOMY TUBE (N/A Abdomen)     Patient location during evaluation: PACU Anesthesia Type: General Level of consciousness: awake Pain management: pain level controlled Vital Signs Assessment: post-procedure vital signs reviewed and stable Respiratory status: spontaneous breathing, nonlabored ventilation, respiratory function stable and patient connected to nasal cannula oxygen Cardiovascular status: blood pressure returned to baseline and stable Postop Assessment: no apparent nausea or vomiting Anesthetic complications: no   No complications documented.  Last Vitals:  Vitals:   05/27/21 1957 05/27/21 2000  BP: (!) 168/97 (!) 162/95  Pulse: 92 85  Resp: 17 16  Temp:  37.1 C  SpO2: 97% 100%    Last Pain:  Vitals:   05/27/21 2000  TempSrc: Oral  PainSc: 7                  Brieana Shimmin P Masiah Lewing

## 2021-05-27 NOTE — Addendum Note (Signed)
Addendum  created 05/27/21 2045 by Leonides Grills, MD   Clinical Note Signed

## 2021-05-27 NOTE — Transfer of Care (Signed)
Immediate Anesthesia Transfer of Care Note  Patient: Cole Ferguson  Procedure(s) Performed: INSERTION OF GASTROSTOMY TUBE (N/A Abdomen)  Patient Location: PACU  Anesthesia Type:General  Level of Consciousness: drowsy  Airway & Oxygen Therapy: Patient Spontanous Breathing and Patient connected to tracheostomy mask oxygen  Post-op Assessment: Report given to RN and Post -op Vital signs reviewed and stable  Post vital signs: Reviewed and stable  Last Vitals:  Vitals Value Taken Time  BP 150/81 05/27/21 0852  Temp    Pulse 76 05/27/21 0854  Resp 15 05/27/21 0854  SpO2 100 % 05/27/21 0854  Vitals shown include unvalidated device data.  Last Pain:  Vitals:   05/27/21 0600  TempSrc:   PainSc: Asleep      Patients Stated Pain Goal: 2 (05/26/21 1047)  Complications: No complications documented.
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
## 2021-05-27 NOTE — Progress Notes (Signed)
Patient has been coughing secretions up and suctioning them up with the yanker.  He requires no deep trach suctioning at this time.
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
## 2021-05-28 ENCOUNTER — Encounter (HOSPITAL_COMMUNITY): Payer: Self-pay | Admitting: Surgery

## 2021-05-28 LAB — GLUCOSE, CAPILLARY
Glucose-Capillary: 137 mg/dL — ABNORMAL HIGH (ref 70–99)
Glucose-Capillary: 144 mg/dL — ABNORMAL HIGH (ref 70–99)
Glucose-Capillary: 119 mg/dL — ABNORMAL HIGH (ref 70–99)
Glucose-Capillary: 82 mg/dL (ref 70–99)
Glucose-Capillary: 119 mg/dL — ABNORMAL HIGH (ref 70–99)

## 2021-05-28 LAB — PHOSPHORUS
Phosphorus: 3 mg/dL (ref 2.5–4.6)
Phosphorus: 2.4 mg/dL — ABNORMAL LOW (ref 2.5–4.6)

## 2021-05-28 LAB — MAGNESIUM
Magnesium: 1.8 mg/dL (ref 1.7–2.4)
Magnesium: 1.7 mg/dL (ref 1.7–2.4)

## 2021-05-28 MED ORDER — OSMOLITE 1.5 CAL PO LIQD
355.0000 mL | Freq: Every day | ORAL | Status: DC
  Administered 2021-05-28: 119 mL
  Administered 2021-05-28: 237 mL
  Administered 2021-05-29 – 2021-05-31 (×12): 355 mL
  Filled 2021-05-28 (×18): qty 474

## 2021-05-28 MED ORDER — METHOCARBAMOL 500 MG PO TABS
1000.0000 mg | ORAL_TABLET | Freq: Three times a day (TID) | ORAL | Status: DC | PRN
  Filled 2021-05-28: qty 2

## 2021-05-28 MED ORDER — METHOCARBAMOL 500 MG PO TABS
1000.0000 mg | ORAL_TABLET | Freq: Three times a day (TID) | ORAL | Status: DC | PRN
  Administered 2021-05-28 – 2021-05-31 (×5): 1000 mg
  Filled 2021-05-28 (×6): qty 2

## 2021-05-28 MED ORDER — OXYCODONE HCL 5 MG/5ML PO SOLN
10.0000 mg | ORAL | Status: DC | PRN
  Administered 2021-05-28 – 2021-05-31 (×13): 10 mg
  Filled 2021-05-28 (×14): qty 10

## 2021-05-28 MED ORDER — HYDROMORPHONE HCL 1 MG/ML IJ SOLN
1.0000 mg | INTRAMUSCULAR | Status: DC | PRN
  Administered 2021-05-28 – 2021-05-29 (×2): 1 mg via INTRAVENOUS
  Filled 2021-05-28 (×3): qty 1

## 2021-05-28 MED ORDER — GABAPENTIN 250 MG/5ML PO SOLN
100.0000 mg | Freq: Three times a day (TID) | ORAL | Status: DC
  Administered 2021-05-28 – 2021-05-31 (×9): 100 mg
  Filled 2021-05-28 (×12): qty 2

## 2021-05-28 MED ORDER — ENOXAPARIN SODIUM 30 MG/0.3ML IJ SOSY
30.0000 mg | PREFILLED_SYRINGE | Freq: Two times a day (BID) | INTRAMUSCULAR | Status: DC
Start: 2021-05-28 — End: 2021-05-31
  Administered 2021-05-28 – 2021-05-31 (×7): 30 mg via SUBCUTANEOUS
  Filled 2021-05-28 (×7): qty 0.3

## 2021-05-28 MED ORDER — PROSOURCE TF PO LIQD
45.0000 mL | Freq: Every day | ORAL | Status: DC
  Administered 2021-05-28 – 2021-05-31 (×4): 45 mL
  Filled 2021-05-28 (×4): qty 45

## 2021-05-28 NOTE — Progress Notes (Signed)
  Speech Language Pathology Patient Details Name: Cole Ferguson MRN: [id number] DOB: [birth year] Today's Date: 05/28/2021 Time:  -      Therapist notified by Nehemiah Settle, PA that ENT does not want pt using PMV at this time.   ST will sign off. Please reconsult if needed. Thank you                                   [name] 05/28/2021, 3:22 PM
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
## 2021-05-28 NOTE — Progress Notes (Signed)
RN called and requested RT to come look at patient due to desating also feeling SOB.  I removed patients inner cannula and found it clogged with a bloody plug.  Patient is not to fond of being suctioned but allowed me to suction him twice both times he generated a strong productive cough.
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
## 2021-05-28 NOTE — Discharge Instructions (Addendum)
Tracheostomy, Care After This sheet gives you information about how to care for yourself after your procedure. Your health care provider may also give you more specific instructions. If you have problems or questions, contact your health care provider. What can I expect after the procedure? After the procedure, it is common to have: Bleeding, oozing, or dried blood around the tracheostomy opening (stoma). Inability to speak with your normal voice. Increased coughing with mucus. Limited sense of smell and taste. Follow these instructions at home: Tracheostomy care Wash your hands with soap and water for 20 seconds before and after touching your dressing or any part of your tracheostomy. Change your bandage (dressing) as told by your health care provider. Make sure that you and your caregivers know how to: Clean and replace your trach tube. Keep your stoma free of mucus. Suction your windpipe (trachea). Care for the skin around your stoma. Protect your trachea and lungs from dry air. To do this: Use a heat moisture exchanger (HME) with your trach tube. You may also use any other device that adds moisture in the air. Use a humidifier in your home to add moisture to the air. Drink plenty of water to maintain adequate hydration. When you change the tracheostomy holder around your neck, make sure that you can fit two fingers between the holder and your neck. Always have supplies available for tracheostomy care.  Eating and drinking Follow instructions from your health care provider about any eating or drinking restrictions. Try adding spices and herbs to foods to improve flavor. Drink enough water or fluids to keep your urine pale yellow and to keep your mucus thin.   Activity If you were given a sedative during the procedure, it can affect you for several hours. Do not drive or operate machinery until your health care provider says that it is safe. Avoid activities that require a lot of  energy for 2 weeks, or for as long as told by your health care provider. Return to your normal activities as told by your health care provider. Ask your health care provider what activities are safe for you. General instructions Keep water out of your tracheostomy tube. When you take a bath or shower, cover your trach tube with a shower shield, as told by your health care provider. Do not swim. Work with a Human resources officer to find the best way for you to communicate. There are many options for communicating with a trach tube. Do not use any products that contain nicotine or tobacco. These products include cigarettes, chewing tobacco, and vaping devices, such as e-cigarettes. If you need help quitting, ask your health care provider. Take over-the-counter and prescription medicines only as told by your health care provider. Wear a medical alert bracelet or necklace that says that you have a trach tube. Keep all follow-up visits. This is important. Contact a health care provider if you have: A fever or chills. A cough that does not go away and produces more mucus. More bleeding around your trach tube and tracheostomy opening. Redness, swelling, drainage, or sores around your trach tube or stoma. Questions about how to care for your tracheostomy. Get help right away if: You have trouble breathing. Your tracheal tube comes out and you cannot replace it. You cough or choke after eating. You have bright red blood coming from your trach tube. These symptoms may represent a serious problem that is an emergency. Do not wait to see if the symptoms will go away. Get medical help right  away. Call your local emergency services (911 in the [place] Do not drive yourself to the hospital. Summary After a tracheostomy, it is common to have dried blood and slight bleeding around the tracheostomy opening. Wash your hands with soap and water for at least 20 seconds before touching your dressing or any part of your  tracheostomy. Make sure that you and your caregivers know how to clean and replace your breathing tube (tracheostomy tube). Get help right away if you have trouble breathing. This information is not intended to replace advice given to you by your health care provider. Make sure you discuss any questions you have with your health care provider. Document Revised: 06/19/2020 Document Reviewed: 06/19/2020 Elsevier Patient Education  2021 [employer]    Gastrostomy Tube Home Guide, Adult A gastrostomy tube, or G-tube, is a tube that is inserted through the abdomen into the stomach. The tube is used to give feedings and medicines when a person cannot eat and drink enough on his or her own or take medicines by mouth. How to care for the insertion site Supplies needed: Saline solution or clean, warm water and soap. Saline solution is made of salt and water. Cotton swab or gauze. Pre-cut gauze bandage (dressing) and tape, if needed. Instructions Follow these steps daily to clean the insertion site: Wash your hands with soap and water for at least 20 seconds. Remove the dressing (if there is one) that is between the person's skin and the tube. Check the area where the tube enters the skin. Check daily for problems such as: Redness, rash, or irritation. Swelling. Pus-like drainage. Extra skin growth. Moisten the cotton swab or gauze with the saline solution or with a soap-and-water mixture. Gently clean around the insertion site. Remove any drainage or crusted material. When the G-tube is first put in, a normal saline solution or water can be used to clean the skin. After the skin around the tube has healed, mild soap and water may be used. Apply a dressing (if there should be one) between the person's skin and the tube.   How to flush a G-tube Flush the G-tube regularly to keep it from clogging. Flush it before and after feedings and as often as told by the health care provider. Supplies  needed: Purified or germ-free (sterile) water, warmed. Container with lid for boiling water, if needed. 60 cc G-tube syringe. Instructions Before you begin, decide whether to use sterile water or purified drinking water. Use only sterile water if: The person has a weak disease-fighting (immune) system. The person has trouble fighting off infections (is immunocompromised). You are unsure about the amount of chemical contaminants in purified or drinking water. Use purified drinking water in all other cases. To purify drinking water by boiling: Boil water for at least 1 minute. Keep lid over water while it boils. Let water cool to room temperature before using. Follow these steps to flush the G-tube: Wash your hands with soap and water for at least 20 seconds. Bring out (draw up) 30 mL of warm water in a syringe. Connect the syringe to the tube. Slowly and gently push the water into the tube. General tips If the tube comes out: Cover the opening with a clean dressing and tape. Get help right away. If there is skin or scar tissue growing where the tube enters the skin: Keep the area clean and dry. Secure the tube with tape so that the tube does not move around too much. If the tube gets  clogged: Slowly push warm water into the tube with a large syringe. Do not force the fluid into the tube or push an object into the tube. Get help right away if you cannot unclog the tube. Follow these instructions at home: Feedings Give feedings at room temperature. If feedings are continuous: Do not put more than 4 hours' worth of feedings in the feeding bag. Stop the feedings when you need to give medicine or flush the tube. Be sure to restart the feedings. Make sure the person's head is above his or her stomach (upright position). This will prevent choking and discomfort. Make sure the person is in the right position during and after feedings. During feedings, have the person in the upright  position. After a non-continuous feeding (bolus feeding), have the person stay in the upright position for 1 hour. Cover and place unused feedings in the refrigerator. Replace feeding bags and syringes as told. Good hygiene Make sure the person takes good care of his or her mouth and teeth (oral hygiene), such as by brushing his or her teeth. Keep the area where the tube enters the skin clean and dry. General instructions Use syringes made only for G-tubes. Do not pull or put tension on the tube. Before you remove the tube cap or disconnect a syringe, close the tube by using a clamp (clamping) or bending (kinking) the tube. Measure the length of the G-tube every day from the insertion site to the end of the tube. If the person's G-tube has a balloon, check the fluid in the balloon every week. Check the manufacturer's specifications to find the amount of fluid that should be in the balloon. Remove excess air from the G-tube as told. This is called venting. Do not push feedings, medicines, or flushes fast. Contact a health care provider if: The person with the tube has constipation or a fever. A large amount of fluid or mucus-like liquid is leaking from the tube. Skin or scar tissue appears to be growing where the tube enters the skin. The length of tube from the insertion site to the G-tube gets longer. Get help right away if: The person with the tube has any of these problems: Severe pain, tenderness, or bloating in the abdomen. Nausea or vomiting. Trouble breathing or shortness of breath. Any of these problems happen in the area where the tube enters the skin: Redness, irritation, swelling, or soreness. Pus-like discharge. A bad smell. The tube is clogged and cannot be flushed. The tube comes out. The tube will need to put back in within 4 hours. Summary A gastrostomy tube, or G-tube, is a tube that is inserted through the abdomen into the stomach. The tube is used to give feedings  and medicines when a person cannot eat and drink enough on his or her own or cannot take medicine by mouth. Check and clean the insertion site daily as told by the person's health care provider. Flush the G-tube regularly to keep it from clogging. Flush it before and after feedings and as often as told. Keep the area where the tube enters the skin clean and dry. This information is not intended to replace advice given to you by your health care provider. Make sure you discuss any questions you have with your health care provider. Document Revised: 04/23/2020 Document Reviewed: 04/26/2020 Elsevier Patient Education  2021 [employer].  [facility]      [facility], [place] [phone]  OPEN ABDOMINAL SURGERY: POST OP INSTRUCTIONS  Always review your  discharge instruction sheet given to you by the facility where your surgery was performed.  IF YOU HAVE DISABILITY OR FAMILY LEAVE FORMS, YOU MUST BRING THEM TO THE OFFICE FOR PROCESSING.  PLEASE DO NOT GIVE THEM TO YOUR DOCTOR.  A prescription for pain medication may be given to you upon discharge.  Take your pain medication as prescribed, if needed.  If narcotic pain medicine is not needed, then you may take acetaminophen (Tylenol) or ibuprofen (Advil) as needed. Take your usually prescribed medications unless otherwise directed. If you need a refill on your pain medication, please contact your pharmacy. They will contact our office to request authorization.  Prescriptions will not be filled after 5pm or on week-ends. You should follow a light diet the first few days after arrival home, such as soup and crackers, pudding, etc.unless your doctor has advised otherwise. A high-fiber, low fat diet can be resumed as tolerated.   Be sure to include lots of fluids daily. Most patients will experience some swelling and bruising on the chest and neck area.  Ice packs will help.  Swelling and bruising can take several days to resolve Most patients will  experience some swelling and bruising in the area of the incision. Ice pack will help. Swelling and bruising can take several days to resolve..  It is common to experience some constipation if taking pain medication after surgery.  Increasing fluid intake and taking a stool softener will usually help or prevent this problem from occurring.  A mild laxative (Milk of Magnesia or Miralax) should be taken according to package directions if there are no bowel movements after 48 hours.  You may have steri-strips (small skin tapes) in place directly over the incision.  These strips should be left on the skin for 7-10 days.  If your surgeon used skin glue on the incision, you may shower in 24 hours.  The glue will flake off over the next 2-3 weeks.  Any sutures or staples will be removed at the office during your follow-up visit. You may find that a light gauze bandage over your incision may keep your staples from being rubbed or pulled. You may shower and replace the bandage daily. ACTIVITIES:  You may resume regular (light) daily activities beginning the next day--such as daily self-care, walking, climbing stairs--gradually increasing activities as tolerated.  You may have sexual intercourse when it is comfortable.  Refrain from any heavy lifting or straining until approved by your doctor. You may drive when you no longer are taking prescription pain medication, you can comfortably wear a seatbelt, and you can safely maneuver your car and apply brakes  You should see your doctor in the office for a follow-up appointment approximately two weeks after your surgery.  Make sure that you call for this appointment within a day or two after you arrive home to insure a convenient appointment time.  WHEN TO CALL YOUR DOCTOR: Fever over 101.0 Inability to urinate Nausea and/or vomiting Extreme swelling or bruising Continued bleeding from incision. Increased pain, redness, or drainage from the incision. Difficulty  swallowing or breathing Muscle cramping or spasms. Numbness or tingling in hands or feet or around lips.  The clinic staff is available to answer your questions during regular business hours.  Please don't hesitate to call and ask to speak to one of the nurses if you have concerns.  For further questions, please visit [email or web address]
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
## 2021-05-29 LAB — GLUCOSE, CAPILLARY
Glucose-Capillary: 110 mg/dL — ABNORMAL HIGH (ref 70–99)
Glucose-Capillary: 113 mg/dL — ABNORMAL HIGH (ref 70–99)
Glucose-Capillary: 113 mg/dL — ABNORMAL HIGH (ref 70–99)
Glucose-Capillary: 146 mg/dL — ABNORMAL HIGH (ref 70–99)
Glucose-Capillary: 183 mg/dL — ABNORMAL HIGH (ref 70–99)
Glucose-Capillary: 97 mg/dL (ref 70–99)

## 2021-05-29 LAB — MAGNESIUM: Magnesium: 1.6 mg/dL — ABNORMAL LOW (ref 1.7–2.4)

## 2021-05-29 LAB — PHOSPHORUS: Phosphorus: 3.8 mg/dL (ref 2.5–4.6)

## 2021-05-29 MED ORDER — DIPHENHYDRAMINE HCL 12.5 MG/5ML PO ELIX
12.5000 mg | ORAL_SOLUTION | Freq: Four times a day (QID) | ORAL | Status: DC | PRN
  Administered 2021-05-30 (×2): 12.5 mg
  Filled 2021-05-29 (×2): qty 5

## 2021-05-29 MED ORDER — DIPHENHYDRAMINE HCL 50 MG/ML IJ SOLN: 12.5000 mg | Freq: Four times a day (QID) | INTRAMUSCULAR | Status: DC | PRN

## 2021-05-29 MED ORDER — PANTOPRAZOLE SODIUM 40 MG PO PACK
40.0000 mg | PACK | Freq: Every day | ORAL | Status: DC
  Administered 2021-05-30 – 2021-05-31 (×2): 40 mg
  Filled 2021-05-29 (×2): qty 20

## 2021-05-29 MED ORDER — TRAMADOL HCL 50 MG PO TABS
50.0000 mg | ORAL_TABLET | Freq: Four times a day (QID) | ORAL | Status: DC
  Administered 2021-05-29 – 2021-05-31 (×9): 50 mg
  Filled 2021-05-29 (×9): qty 1

## 2021-05-29 MED ORDER — PANTOPRAZOLE SODIUM 40 MG IV SOLR: 40.0000 mg | Freq: Every day | INTRAVENOUS | Status: DC

## 2021-05-29 MED ORDER — TRAMADOL 5 MG/ML ORAL SUSPENSION: 50.0000 mg | Freq: Four times a day (QID) | ORAL | Status: DC

## 2021-05-29 NOTE — Progress Notes (Signed)
6/8-Respiratory care note- Trach education performed with patient and his mom.  Trach care booklet left with patient and questions answered for mom.  Pt currently has a #8 cuffed trach in place with the cuff deflated. Pt would probably benefit if the trach was downsized or at least changed to a cuffless trach before being discharged.  Both mom and patient very receptive to education and appropriate questions asked and answered. Mom to call for follow-up education if needed before discharge.  Encouraged mom to assist with pt care before discharge.
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
## 2021-05-29 NOTE — Progress Notes (Signed)
Orthopedic Tech Progress Note Patient Details:  Cole Ferguson Mar 08, 2001 [id number] Was Called for a PHILLY COLLAR for patient so when he takes showers he can still have the support for neck. Ortho Devices Type of Ortho Device: [place] cervical collar Ortho Device/Splint Location: NECK Ortho Device/Splint Interventions: Ordered   Post Interventions Patient Tolerated: Other (comment) Instructions Provided: Other (comment)   Donald Pore 05/29/2021, 8:52 AM

## 2021-05-29 NOTE — TOC Progression Note (Signed)
Transition of Care [facility]) - Progression Note    Patient Details  Name: Cole Ferguson MRN: [id number] Date of Birth: Jun 06, 2001  Transition of Care [facility]) CM/SW Contact  Ella Bodo, RN Phone Number: 05/29/2021, 3:33 PM  Clinical Narrative:   Met with patient and mother this morning to discuss discharge arrangements, i.e., home health nursing, trach supplies, and tube feedings.  Patient and mother motivated and accepting of all information.  Referral has been made to [place] for trach supplies and tube feedings, as well as recommended DME; agency to reach out to mother regarding delivery information for supplies. Patient qualifies for private duty nursing with Medicaid and tracheostomy.  Secured services for home health RN with [facility] [facility]-spoke with Osvaldo Human who will be coordinating this.  Phone number [phone].  Discharge arrangements should be final by Friday, June 10; patient will need 3 to 5 days of trach supplies and tube feeding sent home with him to allow time for delivery of all supplies.  Expected Discharge Plan: [place] Barriers to Discharge: Continued Medical Work up  Expected Discharge Plan and Services Expected Discharge Plan: [place]   Discharge Planning Services: CM Consult Post Acute Care Choice: [place] arrangements for the past 2 months: Single Family Home                 DME Arranged: 3-N-1,Shower stool,Trach supplies,Tube feeding DME Agency: [employer] Date DME Agency Contacted: 05/28/21 Time DME Agency Contacted: [phone] Representative spoke with at DME Agency: Cy Blamer [facility] Arranged: RN,Speech [place] Agency: [place] Date [place]: 05/29/21 Time [place]: [place] Representative spoke with at [place]: Osvaldo Human [phone]   Social Determinants of Health ([place]) Interventions    Readmission Risk  Interventions No flowsheet data found.  Reinaldo Raddle, RN, BSN  Trauma/Neuro ICU Case Manager [phone]

## 2021-05-30 LAB — GLUCOSE, CAPILLARY
Glucose-Capillary: 103 mg/dL — ABNORMAL HIGH (ref 70–99)
Glucose-Capillary: 108 mg/dL — ABNORMAL HIGH (ref 70–99)
Glucose-Capillary: 132 mg/dL — ABNORMAL HIGH (ref 70–99)
Glucose-Capillary: 128 mg/dL — ABNORMAL HIGH (ref 70–99)
Glucose-Capillary: 79 mg/dL (ref 70–99)
Glucose-Capillary: 124 mg/dL — ABNORMAL HIGH (ref 70–99)

## 2021-05-30 MED ORDER — DIPHENHYDRAMINE HCL 50 MG/ML IJ SOLN: 12.5000 mg | Freq: Four times a day (QID) | INTRAMUSCULAR | Status: DC | PRN

## 2021-05-30 MED ORDER — DIPHENHYDRAMINE HCL 12.5 MG/5ML PO ELIX: 12.5000 mg | ORAL_SOLUTION | Freq: Four times a day (QID) | ORAL | Status: DC | PRN

## 2021-05-30 NOTE — Progress Notes (Signed)
Patient's mother administered tube feeding and medications with proper technique.  Denied any questions or concerns.

## 2021-05-30 NOTE — Plan of Care (Signed)
  Problem: Health Behavior/Discharge Planning: Goal: Ability to manage health-related needs will improve Outcome: Progressing   Problem: Clinical Measurements: Goal: Ability to maintain clinical measurements within normal limits will improve Outcome: Progressing   Problem: Clinical Measurements: Goal: Will remain free from infection Outcome: Progressing   Problem: Clinical Measurements: Goal: Diagnostic test results will improve Outcome: Progressing   

## 2021-05-30 NOTE — Plan of Care (Signed)

## 2021-05-30 NOTE — TOC Progression Note (Addendum)
Transition of Care [facility]) - Progression Note    Patient Details  Name: Cole Ferguson MRN: [id number] Date of Birth: Jun 29, 2001  Transition of Care [facility]) CM/SW Contact  Oren Section Cleta Alberts, RN Phone Number: 05/30/2021, 12:30  Clinical Narrative:   Met with patient's mother, per her request.  She states tube feeding administration is going well; she states she still feels hesitant about trach teaching.  She is agreeable to additional tracheostomy teaching from respiratory therapy; bedside nurse to notify RT of need for additional teaching this afternoon.  Patient will need 3 to 5 days of trach supplies and tube feeding/supplies at discharge; this has been ordered and obtained in preparation for discharge tomorrow.  Mother now requesting hospital bed, and [place] has been notified of new order.  Will touch base with [place] case manager, Cole Ferguson to finalize plan and follow-up for patient at discharge.  Addendum: 51 Spoke with Cole Ferguson,(phone: [phone]) case manager with [facility], regarding nursing services through patient's Medicaid.  Cole states he will have a letter of medical necessity for patient's physician to sign; he will follow-up with case manager on Friday for signature.  Cole states he will reach out to patient's mother to confirm need for nursing services and follow-up.    Expected Discharge Plan: [place] Barriers to Discharge: Continued Medical Work up  Expected Discharge Plan and Services Expected Discharge Plan: [place]   Discharge Planning Services: CM Consult Post Acute Care Choice: Home Health, Durable Medical Equipment Living arrangements for the past 2 months: [place]                 DME Arranged: 3-N-1, Hospital bed, Trach supplies, Tube feeding, Shower stool DME Agency: [employer] Date DME Agency Contacted: 05/28/21 Time DME Agency Contacted: [phone] Representative spoke with at DME Agency:  Cy Blamer HH Arranged: RN, Speech Therapy, OT [facility] Agency: [place] Date [place]: 05/29/21 Time [place]: [place] Representative spoke with at [place]: Cole Ferguson [phone]   Social Determinants of Health ([place]) Interventions    Readmission Risk Interventions No flowsheet data found.  Reinaldo Raddle, RN, BSN  Trauma/Neuro ICU Case Manager [phone]
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
## 2021-05-30 NOTE — Progress Notes (Signed)
NEUROSURGERY PROGRESS NOTE  Doing well. Complains of appropriate neck soreness. No arm pain Collar in place  Temp:  [97.6 F (36.4 C)-98.8 F (37.1 C)] 97.6 F (36.4 C) (06/09 0754) Pulse Rate:  [63-76] 63 (06/09 0754) Resp:  [15-18] 17 (06/09 0754) BP: (146-164)/(86-102) 146/102 (06/09 0754) SpO2:  [89 %-98 %] 97 % (06/09 0754) FiO2 (%):  [21 %] 21 % (06/09 0752)  Plan: No new nsgy recommendations. Will follow up outpatient 1-2 week after discharge.   Sherryl Manges, NP 05/30/2021 8:31 AM
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
## 2021-05-30 NOTE — Progress Notes (Signed)
PT Cancellation Note  Patient Details Name: Cole Ferguson MRN: [id number] DOB: 11/30/2001   Cancelled Treatment:    Reason Eval/Treat Not Completed: Patient declined, no reason specified. Pt requested PT follow-up tomorrow instead as he reports he has already ambulated and navigated stairs this date and was about to nap. Will plan to attempt follow-up tomorrow per pt request.   Raymond Gurney, PT, DPT Acute Rehabilitation Services  Pager: [phone] Office: [phone]    Jewel Baize 05/30/2021, 4:22 PM

## 2021-05-30 NOTE — Progress Notes (Signed)
Patient's mother provided trach care and tracheal suctioning with the assistance of RT. Patient's mother had no questions or concerns at this time.

## 2021-05-31 ENCOUNTER — Other Ambulatory Visit (HOSPITAL_COMMUNITY): Payer: Self-pay

## 2021-05-31 LAB — GLUCOSE, CAPILLARY
Glucose-Capillary: 106 mg/dL — ABNORMAL HIGH (ref 70–99)
Glucose-Capillary: 106 mg/dL — ABNORMAL HIGH (ref 70–99)
Glucose-Capillary: 122 mg/dL — ABNORMAL HIGH (ref 70–99)

## 2021-05-31 MED ORDER — PROSOURCE TF PO LIQD: 45.0000 mL | Freq: Every day | ORAL | Status: DC

## 2021-05-31 MED ORDER — OXYCODONE HCL 5 MG/5ML PO SOLN
10.0000 mg | Freq: Four times a day (QID) | ORAL | 0 refills | Status: DC | PRN
  Filled 2021-05-31: qty 200, 5d supply, fill #0

## 2021-05-31 MED ORDER — OSMOLITE 1.5 CAL PO LIQD: 355.0000 mL | Freq: Every day | ORAL | 0 refills | Status: DC

## 2021-05-31 MED ORDER — TRAMADOL HCL 50 MG PO TABS
50.0000 mg | ORAL_TABLET | Freq: Four times a day (QID) | ORAL | 0 refills | Status: DC | PRN
  Filled 2021-05-31: qty 20, 5d supply, fill #0

## 2021-05-31 MED ORDER — GABAPENTIN 250 MG/5ML PO SOLN
100.0000 mg | Freq: Three times a day (TID) | ORAL | 0 refills | Status: DC
  Filled 2021-05-31: qty 120, 20d supply, fill #0

## 2021-05-31 MED ORDER — POLYETHYLENE GLYCOL 3350 17 G PO PACK: 17.0000 g | PACK | Freq: Every day | ORAL | 0 refills | Status: DC | PRN

## 2021-05-31 MED ORDER — METHOCARBAMOL 500 MG PO TABS
1000.0000 mg | ORAL_TABLET | Freq: Three times a day (TID) | ORAL | 0 refills | Status: DC | PRN
  Filled 2021-05-31: qty 60, 10d supply, fill #0

## 2021-05-31 NOTE — Progress Notes (Signed)
Discharge instructions discussed with patient and mother.  Both verbalize understanding, deny questions.  Prescriptions have been delivered and all supplies sent with patient as instructed by case management and home health.  No changes noted in patient assessment.  All belongings accounted for by patient and mother.  Discharged to home with home health to private vehicle via wheelchair.

## 2021-05-31 NOTE — Progress Notes (Signed)
Trach sutures removed per MD order. 

## 2021-05-31 NOTE — TOC Transition Note (Addendum)
Transition of Care (TOC) - CM/SW Discharge Note Donn Pierini RN,BSN Transitions of Care Unit 4NP (non trauma) - RN Case Manager See Treatment Team for direct Phone #  Trauma coverage  Patient Details  Name: Cole Ferguson MRN: [id number] Date of Birth: [birth year]  Transition of Care [facility]) CM/SW Contact:  Darrold Span, RN Phone Number: 05/31/2021, 2:00 PM   Clinical Narrative:    Pt stable for transition home today with mom. DME has been arranged, per Ian Malkin with [employer] supplies will be dropped shipped for trach and TF supplies- unit staff will provide supplies for 3-5 days on discharge to cover until shipment arrives. Pt will be sent home with current #8 Trach size as well as a #6 along with Ambu bag. Suction for home should be delivered to room prior to discharge.   Spoke with Janey Greaser at [facility] who is going to be working on [employer] for private duty nursing in the home. Paperwork has been Warden/ranger for MD signature. Dr Janee Morn has signed and paperwork faxed back to [facility] at [phone].  Per Rob this approval process can take up to 1-2 weeks. Mom has been educated on [facility] and TF and is comfortable with care at home in the interim.  Spoke with mom/patient at the bedside, mom to transport home, provided mom with update on private duty assistance process, as well as provided info on finding a PCP, list of providers provided for [facility]. Explained pt will need a PCP for the HH/private duty services if approved.  Mom voiced understanding and states she will f/u.   1400- spoke with Ian Malkin at [employer] regarding delivery of compressor for Trach humidification needs at home. Zach working on making sure compressor gets delivered to the home today, awaiting to hear timeframe for delivery.   1600- Zach spoke with mom via TC regarding compressor delivery.    Final next level of care: Home w Home Health Services Barriers to Discharge: Barriers  Resolved   Patient Goals and CMS Choice Patient states their goals for this hospitalization and ongoing recovery are:: to go home CMS Medicare.gov Compare Post Acute Care list provided to:: Other (Comment Required) (mother) Choice offered to / list presented to : Patient, Parent  Discharge Placement                 Home      Discharge Plan and Services   Discharge Planning Services: CM Consult Post Acute Care Choice: Home Health, Durable Medical Equipment          DME Arranged: 3-N-1, Hospital bed, Trach supplies, Tube feeding, Shower stool DME Agency: [employer] Date DME Agency Contacted: 05/28/21 Time DME Agency Contacted: [phone] Representative spoke with at DME Agency: Lenard Galloway HH Arranged: RN, Speech Therapy, OT [facility] Agency: [facility] Date [facility] Agency Contacted: 05/29/21 Time HH Agency Contacted: 1533 Representative spoke with at [facility] Agency: Lahoma Crocker [phone]  Social Determinants of Health (SDOH) Interventions     Readmission Risk Interventions Readmission Risk Prevention Plan 05/31/2021  Post Dischage Appt Complete  Medication Screening Complete  Transportation Screening Complete
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
## 2021-06-27 ENCOUNTER — Ambulatory Visit: Payer: Medicaid Other | Admitting: Internal Medicine

## 2021-07-11 ENCOUNTER — Other Ambulatory Visit (HOSPITAL_COMMUNITY): Payer: Self-pay | Admitting: Neurosurgery

## 2021-07-11 ENCOUNTER — Other Ambulatory Visit: Payer: Self-pay | Admitting: Neurosurgery

## 2021-07-11 DIAGNOSIS — M542 Cervicalgia: Secondary | ICD-10-CM

## 2021-07-12 ENCOUNTER — Ambulatory Visit: Payer: Medicaid Other | Admitting: Internal Medicine

## 2021-07-24 ENCOUNTER — Ambulatory Visit (HOSPITAL_COMMUNITY)
Admission: RE | Admit: 2021-07-24 | Discharge: 2021-07-24 | Disposition: A | Discharge status: Home / Self Care | Payer: Medicaid Other | Source: Ambulatory Visit | Attending: Neurosurgery | Admitting: Neurosurgery

## 2021-07-24 ENCOUNTER — Other Ambulatory Visit: Payer: Self-pay

## 2021-07-24 DIAGNOSIS — M542 Cervicalgia: Secondary | ICD-10-CM | POA: Diagnosis present

## 2021-08-13 ENCOUNTER — Other Ambulatory Visit: Payer: Self-pay | Admitting: Neurosurgery

## 2021-08-15 ENCOUNTER — Encounter (HOSPITAL_COMMUNITY): Payer: Self-pay | Admitting: Neurosurgery

## 2021-08-15 ENCOUNTER — Other Ambulatory Visit: Payer: Self-pay

## 2021-08-15 NOTE — Progress Notes (Addendum)
Cole Ferguson denies chest pain or shortness of breath.  Patient asked me to speak to his mother for the rest of the interview and instructions. I spoke to Cole Ferguson, Cole Ferguson's mother.. Cole Ferguson Patient denies having any s/s of Covid in her household.  Patient has not had any known exposure to Covid.   Audldone wears a neck brace 24/7. Patient is not taking Aleve as instructed by Dr. Lonie Peak office.

## 2021-08-16 ENCOUNTER — Encounter (HOSPITAL_COMMUNITY): Payer: Self-pay | Admitting: Neurosurgery

## 2021-08-16 ENCOUNTER — Inpatient Hospital Stay (HOSPITAL_COMMUNITY): Payer: Medicaid Other | Admitting: Certified Registered Nurse Anesthetist

## 2021-08-16 ENCOUNTER — Inpatient Hospital Stay (HOSPITAL_COMMUNITY)
Admission: RE | Admit: 2021-08-16 | Discharge: 2021-08-17 | DRG: 473 | Disposition: A | Discharge status: Home / Self Care | Payer: Medicaid Other | Attending: Neurosurgery | Admitting: Neurosurgery

## 2021-08-16 ENCOUNTER — Inpatient Hospital Stay (HOSPITAL_COMMUNITY)
Admission: RE | Disposition: A | Discharge status: Home / Self Care | Payer: Self-pay | Source: Home / Self Care | Attending: Neurosurgery

## 2021-08-16 ENCOUNTER — Other Ambulatory Visit: Payer: Self-pay

## 2021-08-16 ENCOUNTER — Ambulatory Visit: Payer: Medicaid Other | Admitting: Internal Medicine

## 2021-08-16 ENCOUNTER — Inpatient Hospital Stay (HOSPITAL_COMMUNITY): Payer: Medicaid Other

## 2021-08-16 DIAGNOSIS — M4712 Other spondylosis with myelopathy, cervical region: Principal | ICD-10-CM | POA: Diagnosis present

## 2021-08-16 DIAGNOSIS — F1210 Cannabis abuse, uncomplicated: Secondary | ICD-10-CM | POA: Diagnosis present

## 2021-08-16 DIAGNOSIS — Z791 Long term (current) use of non-steroidal anti-inflammatories (NSAID): Secondary | ICD-10-CM

## 2021-08-16 DIAGNOSIS — M4802 Spinal stenosis, cervical region: Secondary | ICD-10-CM | POA: Diagnosis present

## 2021-08-16 DIAGNOSIS — Z20822 Contact with and (suspected) exposure to covid-19: Secondary | ICD-10-CM | POA: Diagnosis present

## 2021-08-16 DIAGNOSIS — G959 Disease of spinal cord, unspecified: Secondary | ICD-10-CM | POA: Diagnosis present

## 2021-08-16 DIAGNOSIS — Z79899 Other long term (current) drug therapy: Secondary | ICD-10-CM

## 2021-08-16 DIAGNOSIS — Z419 Encounter for procedure for purposes other than remedying health state, unspecified: Secondary | ICD-10-CM

## 2021-08-16 HISTORY — PX: ANTERIOR CERVICAL CORPECTOMY: SHX1159

## 2021-08-16 HISTORY — DX: Family history of other specified conditions: Z84.89

## 2021-08-16 HISTORY — DX: Unspecified injury of head, initial encounter: S09.90XA

## 2021-08-16 LAB — SARS CORONAVIRUS 2 BY RT PCR (HOSPITAL ORDER, PERFORMED IN ~~LOC~~ HOSPITAL LAB): SARS Coronavirus 2: NEGATIVE

## 2021-08-16 LAB — CBC
HCT: 46 % (ref 39.0–52.0)
Hemoglobin: 15.1 g/dL (ref 13.0–17.0)
MCH: 28.5 pg (ref 26.0–34.0)
MCHC: 32.8 g/dL (ref 30.0–36.0)
MCV: 87 fL (ref 80.0–100.0)
Platelets: 217 10*3/uL (ref 150–400)
RBC: 5.29 MIL/uL (ref 4.22–5.81)
RDW: 13.4 % (ref 11.5–15.5)
WBC: 5.9 10*3/uL (ref 4.0–10.5)
nRBC: 0 % (ref 0.0–0.2)

## 2021-08-16 SURGERY — ANTERIOR CERVICAL CORPECTOMY
Anesthesia: General | Site: Spine Cervical

## 2021-08-16 MED ORDER — ACETAMINOPHEN 500 MG PO TABS
1000.0000 mg | ORAL_TABLET | Freq: Once | ORAL | Status: AC
  Administered 2021-08-16: 1000 mg via ORAL
  Filled 2021-08-16: qty 2

## 2021-08-16 MED ORDER — SUGAMMADEX SODIUM 200 MG/2ML IV SOLN
INTRAVENOUS | Status: DC | PRN
  Administered 2021-08-16: 200 mg via INTRAVENOUS

## 2021-08-16 MED ORDER — THROMBIN 5000 UNITS EX SOLR
OROMUCOSAL | Status: DC | PRN
  Administered 2021-08-16 (×2): 5 mL via TOPICAL

## 2021-08-16 MED ORDER — FENTANYL CITRATE (PF) 250 MCG/5ML IJ SOLN
INTRAMUSCULAR | Status: DC | PRN
  Administered 2021-08-16: 100 ug via INTRAVENOUS
  Administered 2021-08-16 (×2): 50 ug via INTRAVENOUS
  Administered 2021-08-16: 100 ug via INTRAVENOUS

## 2021-08-16 MED ORDER — DEXAMETHASONE SODIUM PHOSPHATE 10 MG/ML IJ SOLN
INTRAMUSCULAR | Status: DC | PRN
Start: 2021-08-16 — End: 2021-08-16
  Administered 2021-08-16: 10 mg via INTRAVENOUS

## 2021-08-16 MED ORDER — CHLORHEXIDINE GLUCONATE CLOTH 2 % EX PADS: 6.0000 | MEDICATED_PAD | Freq: Once | CUTANEOUS | Status: DC

## 2021-08-16 MED ORDER — MENTHOL 3 MG MT LOZG: 1.0000 | LOZENGE | OROMUCOSAL | Status: DC | PRN

## 2021-08-16 MED ORDER — CHLORHEXIDINE GLUCONATE 0.12 % MT SOLN: 15.0000 mL | Freq: Once | OROMUCOSAL | Status: AC

## 2021-08-16 MED ORDER — PROMETHAZINE HCL 25 MG/ML IJ SOLN: 6.2500 mg | INTRAMUSCULAR | Status: DC | PRN

## 2021-08-16 MED ORDER — AMISULPRIDE (ANTIEMETIC) 5 MG/2ML IV SOLN: 10.0000 mg | Freq: Once | INTRAVENOUS | Status: DC | PRN

## 2021-08-16 MED ORDER — MIDAZOLAM HCL 2 MG/2ML IJ SOLN
INTRAMUSCULAR | Status: AC
  Filled 2021-08-16: qty 2

## 2021-08-16 MED ORDER — DEXAMETHASONE SODIUM PHOSPHATE 10 MG/ML IJ SOLN
INTRAMUSCULAR | Status: AC
  Filled 2021-08-16: qty 1

## 2021-08-16 MED ORDER — PROPOFOL 10 MG/ML IV BOLUS
INTRAVENOUS | Status: DC | PRN
  Administered 2021-08-16: 30 mg via INTRAVENOUS
  Administered 2021-08-16: 170 mg via INTRAVENOUS

## 2021-08-16 MED ORDER — ACETAMINOPHEN 325 MG PO TABS
650.0000 mg | ORAL_TABLET | ORAL | Status: DC | PRN
  Administered 2021-08-17: 650 mg via ORAL
  Filled 2021-08-16: qty 2

## 2021-08-16 MED ORDER — THROMBIN 5000 UNITS EX SOLR
CUTANEOUS | Status: AC
  Filled 2021-08-16: qty 5000

## 2021-08-16 MED ORDER — METHOCARBAMOL 500 MG PO TABS
500.0000 mg | ORAL_TABLET | Freq: Three times a day (TID) | ORAL | Status: DC | PRN
  Administered 2021-08-17: 1000 mg via ORAL
  Filled 2021-08-16: qty 2

## 2021-08-16 MED ORDER — ONDANSETRON HCL 4 MG/2ML IJ SOLN: 4.0000 mg | Freq: Four times a day (QID) | INTRAMUSCULAR | Status: DC | PRN

## 2021-08-16 MED ORDER — ONDANSETRON HCL 4 MG PO TABS: 4.0000 mg | ORAL_TABLET | Freq: Four times a day (QID) | ORAL | Status: DC | PRN

## 2021-08-16 MED ORDER — SODIUM CHLORIDE 0.9% FLUSH
3.0000 mL | Freq: Two times a day (BID) | INTRAVENOUS | Status: DC
  Administered 2021-08-16 – 2021-08-17 (×2): 3 mL via INTRAVENOUS

## 2021-08-16 MED ORDER — THROMBIN 20000 UNITS EX SOLR
CUTANEOUS | Status: DC | PRN
  Administered 2021-08-16: 20 mL via TOPICAL

## 2021-08-16 MED ORDER — SODIUM CHLORIDE 0.9% FLUSH: 3.0000 mL | INTRAVENOUS | Status: DC | PRN

## 2021-08-16 MED ORDER — NAPROXEN 250 MG PO TABS
250.0000 mg | ORAL_TABLET | Freq: Two times a day (BID) | ORAL | Status: DC | PRN
  Administered 2021-08-16: 250 mg via ORAL
  Filled 2021-08-16: qty 2

## 2021-08-16 MED ORDER — CHLORHEXIDINE GLUCONATE 0.12 % MT SOLN
OROMUCOSAL | Status: AC
  Administered 2021-08-16: 15 mL via OROMUCOSAL
  Filled 2021-08-16: qty 15

## 2021-08-16 MED ORDER — 0.9 % SODIUM CHLORIDE (POUR BTL) OPTIME
TOPICAL | Status: DC | PRN
  Administered 2021-08-16: 1000 mL

## 2021-08-16 MED ORDER — ALUM & MAG HYDROXIDE-SIMETH 200-200-20 MG/5ML PO SUSP: 30.0000 mL | Freq: Four times a day (QID) | ORAL | Status: DC | PRN

## 2021-08-16 MED ORDER — DEXAMETHASONE SODIUM PHOSPHATE 10 MG/ML IJ SOLN: 10.0000 mg | Freq: Once | INTRAMUSCULAR | Status: DC

## 2021-08-16 MED ORDER — CEFAZOLIN SODIUM-DEXTROSE 2-4 GM/100ML-% IV SOLN
2.0000 g | INTRAVENOUS | Status: AC
  Administered 2021-08-16: 2 g via INTRAVENOUS

## 2021-08-16 MED ORDER — ORAL CARE MOUTH RINSE: 15.0000 mL | Freq: Once | OROMUCOSAL | Status: AC

## 2021-08-16 MED ORDER — FENTANYL CITRATE (PF) 250 MCG/5ML IJ SOLN
INTRAMUSCULAR | Status: AC
  Filled 2021-08-16: qty 5

## 2021-08-16 MED ORDER — SUCCINYLCHOLINE CHLORIDE 200 MG/10ML IV SOSY
PREFILLED_SYRINGE | INTRAVENOUS | Status: DC | PRN
  Administered 2021-08-16: 100 mg via INTRAVENOUS

## 2021-08-16 MED ORDER — DEXMEDETOMIDINE HCL IN NACL 200 MCG/50ML IV SOLN
INTRAVENOUS | Status: AC
  Filled 2021-08-16: qty 50

## 2021-08-16 MED ORDER — LACTATED RINGERS IV SOLN: INTRAVENOUS | Status: DC

## 2021-08-16 MED ORDER — PHENOL 1.4 % MT LIQD: 1.0000 | OROMUCOSAL | Status: DC | PRN

## 2021-08-16 MED ORDER — THROMBIN 20000 UNITS EX SOLR
CUTANEOUS | Status: AC
  Filled 2021-08-16: qty 20000

## 2021-08-16 MED ORDER — CYCLOBENZAPRINE HCL 10 MG PO TABS
10.0000 mg | ORAL_TABLET | Freq: Three times a day (TID) | ORAL | Status: DC | PRN
  Administered 2021-08-16 – 2021-08-17 (×2): 10 mg via ORAL
  Filled 2021-08-16 (×2): qty 1

## 2021-08-16 MED ORDER — ONDANSETRON HCL 4 MG/2ML IJ SOLN
INTRAMUSCULAR | Status: DC | PRN
  Administered 2021-08-16: 4 mg via INTRAVENOUS

## 2021-08-16 MED ORDER — FENTANYL CITRATE (PF) 100 MCG/2ML IJ SOLN: 25.0000 ug | INTRAMUSCULAR | Status: DC | PRN

## 2021-08-16 MED ORDER — CEFAZOLIN SODIUM-DEXTROSE 2-4 GM/100ML-% IV SOLN
INTRAVENOUS | Status: AC
  Administered 2021-08-17: 2 g via INTRAVENOUS
  Filled 2021-08-16: qty 100

## 2021-08-16 MED ORDER — ACETAMINOPHEN 650 MG RE SUPP: 650.0000 mg | RECTAL | Status: DC | PRN

## 2021-08-16 MED ORDER — CEFAZOLIN SODIUM-DEXTROSE 2-4 GM/100ML-% IV SOLN
2.0000 g | Freq: Three times a day (TID) | INTRAVENOUS | Status: AC
Start: 2021-08-17 — End: 2021-08-17
  Administered 2021-08-17: 2 g via INTRAVENOUS
  Filled 2021-08-16 (×2): qty 100

## 2021-08-16 MED ORDER — CELECOXIB 200 MG PO CAPS
200.0000 mg | ORAL_CAPSULE | Freq: Once | ORAL | Status: AC
  Administered 2021-08-16: 200 mg via ORAL
  Filled 2021-08-16: qty 1

## 2021-08-16 MED ORDER — PROPOFOL 10 MG/ML IV BOLUS
INTRAVENOUS | Status: AC
  Filled 2021-08-16: qty 40

## 2021-08-16 MED ORDER — SODIUM CHLORIDE 0.9 % IV SOLN
250.0000 mL | INTRAVENOUS | Status: DC
  Administered 2021-08-16: 250 mL via INTRAVENOUS

## 2021-08-16 MED ORDER — MIDAZOLAM HCL 5 MG/5ML IJ SOLN
INTRAMUSCULAR | Status: DC | PRN
  Administered 2021-08-16: 2 mg via INTRAVENOUS

## 2021-08-16 MED ORDER — ONDANSETRON HCL 4 MG/2ML IJ SOLN
INTRAMUSCULAR | Status: AC
  Filled 2021-08-16: qty 2

## 2021-08-16 MED ORDER — PROPOFOL 10 MG/ML IV BOLUS
INTRAVENOUS | Status: AC
  Filled 2021-08-16: qty 20

## 2021-08-16 MED ORDER — PANTOPRAZOLE SODIUM 40 MG IV SOLR
40.0000 mg | Freq: Every day | INTRAVENOUS | Status: DC
  Administered 2021-08-16: 40 mg via INTRAVENOUS
  Filled 2021-08-16: qty 40

## 2021-08-16 MED ORDER — ROCURONIUM BROMIDE 10 MG/ML (PF) SYRINGE
PREFILLED_SYRINGE | INTRAVENOUS | Status: DC | PRN
  Administered 2021-08-16: 20 mg via INTRAVENOUS
  Administered 2021-08-16: 50 mg via INTRAVENOUS
  Administered 2021-08-16: 10 mg via INTRAVENOUS

## 2021-08-16 MED ORDER — HYDROMORPHONE HCL 1 MG/ML IJ SOLN
0.5000 mg | INTRAMUSCULAR | Status: DC | PRN
  Administered 2021-08-17 (×3): 0.5 mg via INTRAVENOUS
  Filled 2021-08-16 (×3): qty 1

## 2021-08-16 MED ORDER — LIDOCAINE 2% (20 MG/ML) 5 ML SYRINGE
INTRAMUSCULAR | Status: AC
  Filled 2021-08-16: qty 5

## 2021-08-16 SURGICAL SUPPLY — 59 items
BAG COUNTER SPONGE SURGICOUNT (BAG) ×4 IMPLANT
BAND RUBBER #18 3X1/16 STRL (MISCELLANEOUS) ×4 IMPLANT
BASKET BONE COLLECTION (BASKET) ×2 IMPLANT
BENZOIN TINCTURE PRP APPL 2/3 (GAUZE/BANDAGES/DRESSINGS) ×2 IMPLANT
BIT DRILL NEURO 2X3.1 SFT TUCH (MISCELLANEOUS) ×1 IMPLANT
BIT DRILL SMALL W/STOP 14 (BIT) ×2 IMPLANT
BONE VIVIGEN FORMABLE 1.3CC (Bone Implant) ×4 IMPLANT
BUR MATCHSTICK NEURO 3.0 LAGG (BURR) ×2 IMPLANT
CANISTER SUCT 3000ML PPV (MISCELLANEOUS) ×2 IMPLANT
CARTRIDGE OIL MAESTRO DRILL (MISCELLANEOUS) ×1 IMPLANT
CLSR STERI-STRIP ANTIMIC 1/2X4 (GAUZE/BANDAGES/DRESSINGS) ×2 IMPLANT
DECANTER SPIKE VIAL GLASS SM (MISCELLANEOUS) IMPLANT
DERMABOND ADVANCED (GAUZE/BANDAGES/DRESSINGS) ×1
DERMABOND ADVANCED .7 DNX12 (GAUZE/BANDAGES/DRESSINGS) ×1 IMPLANT
DIFFUSER DRILL AIR PNEUMATIC (MISCELLANEOUS) ×2 IMPLANT
DRAPE C-ARM 42X72 X-RAY (DRAPES) ×4 IMPLANT
DRAPE LAPAROTOMY 100X72 PEDS (DRAPES) ×2 IMPLANT
DRAPE MICROSCOPE LEICA (MISCELLANEOUS) ×2 IMPLANT
DRILL NEURO 2X3.1 SOFT TOUCH (MISCELLANEOUS) ×2
DRSG OPSITE POSTOP 4X6 (GAUZE/BANDAGES/DRESSINGS) ×2 IMPLANT
DURAPREP 6ML APPLICATOR 50/CS (WOUND CARE) ×2 IMPLANT
ELECT COATED BLADE 2.86 ST (ELECTRODE) ×2 IMPLANT
ELECT REM PT RETURN 9FT ADLT (ELECTROSURGICAL) ×2
ELECTRODE REM PT RTRN 9FT ADLT (ELECTROSURGICAL) ×1 IMPLANT
GAUZE 4X4 16PLY ~~LOC~~+RFID DBL (SPONGE) ×2 IMPLANT
GAUZE SPONGE 4X4 12PLY STRL (GAUZE/BANDAGES/DRESSINGS) IMPLANT
GLOVE EXAM NITRILE XL STR (GLOVE) IMPLANT
GLOVE SURG ENC MOIS LTX SZ7 (GLOVE) IMPLANT
GLOVE SURG ENC MOIS LTX SZ8 (GLOVE) ×2 IMPLANT
GLOVE SURG UNDER LTX SZ8.5 (GLOVE) ×2 IMPLANT
GLOVE SURG UNDER POLY LF SZ7 (GLOVE) IMPLANT
GOWN STRL REUS W/ TWL LRG LVL3 (GOWN DISPOSABLE) IMPLANT
GOWN STRL REUS W/ TWL XL LVL3 (GOWN DISPOSABLE) ×1 IMPLANT
GOWN STRL REUS W/TWL 2XL LVL3 (GOWN DISPOSABLE) IMPLANT
GOWN STRL REUS W/TWL LRG LVL3 (GOWN DISPOSABLE)
GOWN STRL REUS W/TWL XL LVL3 (GOWN DISPOSABLE) ×2
HALTER HD/CHIN CERV TRACTION D (MISCELLANEOUS) ×2 IMPLANT
HEMOSTAT POWDER KIT SURGIFOAM (HEMOSTASIS) ×4 IMPLANT
KIT BASIN OR (CUSTOM PROCEDURE TRAY) ×2 IMPLANT
KIT TURNOVER KIT B (KITS) ×2 IMPLANT
NEEDLE SPNL 20GX3.5 QUINCKE YW (NEEDLE) ×2 IMPLANT
NS IRRIG 1000ML POUR BTL (IV SOLUTION) ×2 IMPLANT
OIL CARTRIDGE MAESTRO DRILL (MISCELLANEOUS) ×2
PACK LAMINECTOMY NEURO (CUSTOM PROCEDURE TRAY) ×2 IMPLANT
PIN DISTRACTION 14MM (PIN) ×4 IMPLANT
PLATE ANT CERV XTEND 2 LV 32 (Plate) ×2 IMPLANT
SCREW XTD VAR 4.2 SELF TAP 16 (Screw) ×8 IMPLANT
SPACER NIKO CORP 12X14 23MM-5 (Spacer) ×2 IMPLANT
SPONGE INTESTINAL PEANUT (DISPOSABLE) ×4 IMPLANT
SPONGE SURGIFOAM ABS GEL 100 (HEMOSTASIS) ×2 IMPLANT
STRIP CLOSURE SKIN 1/2X4 (GAUZE/BANDAGES/DRESSINGS) ×2 IMPLANT
SUT VIC AB 3-0 SH 8-18 (SUTURE) ×4 IMPLANT
SUT VICRYL 4-0 PS2 18IN ABS (SUTURE) ×2 IMPLANT
TAPE CLOTH 4X10 WHT NS (GAUZE/BANDAGES/DRESSINGS) IMPLANT
TOWEL GREEN STERILE (TOWEL DISPOSABLE) ×2 IMPLANT
TOWEL GREEN STERILE FF (TOWEL DISPOSABLE) ×2 IMPLANT
TRAY FOL W/BAG SLVR 16FR STRL (SET/KITS/TRAYS/PACK) ×1 IMPLANT
TRAY FOLEY W/BAG SLVR 16FR LF (SET/KITS/TRAYS/PACK) ×2
WATER STERILE IRR 1000ML POUR (IV SOLUTION) ×2 IMPLANT
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
## 2021-08-16 NOTE — Transfer of Care (Signed)
Immediate Anesthesia Transfer of Care Note  Patient: Cole Ferguson  Procedure(s) Performed: Corpectomy - Cervical four with anterior cervical plating Cervical three-Cervical five (Spine Cervical)  Patient Location: PACU  Anesthesia Type:General  Level of Consciousness: awake, alert , drowsy and patient cooperative  Airway & Oxygen Therapy: Patient Spontanous Breathing  Post-op Assessment: Report given to RN, Post -op Vital signs reviewed and stable and Patient moving all extremities X 4  Post vital signs: Reviewed and stable  Last Vitals:  Vitals Value Taken Time  BP 142/77 08/16/21 1930  Temp    Pulse 85 08/16/21 1935  Resp 15 08/16/21 1935  SpO2 97 % 08/16/21 1935  Vitals shown include unvalidated device data.  Last Pain:  Vitals:   08/16/21 1214  TempSrc:   PainSc: 0-No pain         Complications: No notable events documented.

## 2021-08-16 NOTE — Anesthesia Preprocedure Evaluation (Addendum)
Anesthesia Evaluation  Patient identified by MRN, date of birth, ID band Patient awake    Reviewed: Allergy & Precautions, NPO status , Patient's Chart, lab work & pertinent test results  History of Anesthesia Complications Negative for: history of anesthetic complications  Airway Mallampati: II  TM Distance: >3 FB Neck ROM: Limited    Dental  (+) Dental Advisory Given   Pulmonary  Tracheostomy     (-) decreased breath sounds      Cardiovascular negative cardio ROS   Rhythm:Regular Rate:Normal     Neuro/Psych PSYCHIATRIC DISORDERS    GI/Hepatic negative GI ROS, (+)     substance abuse  ,   Endo/Other  negative endocrine ROS  Renal/GU negative Renal ROS     Musculoskeletal negative musculoskeletal ROS (+)   Abdominal   Peds  (+) ADHD Hematology negative hematology ROS (+)   Anesthesia Other Findings   Reproductive/Obstetrics                            Anesthesia Physical  Anesthesia Plan  ASA: 3  Anesthesia Plan: General   Post-op Pain Management:    Induction: Intravenous  PONV Risk Score and Plan: 4 or greater and Ondansetron, Dexamethasone, Treatment may vary due to age or medical condition and Midazolam  Airway Management Planned: Video Laryngoscope Planned and Oral ETT  Additional Equipment:   Intra-op Plan:   Post-operative Plan: Extubation in OR  Informed Consent:     Dental advisory given  Plan Discussed with: Anesthesiologist and CRNA  Anesthesia Plan Comments:        Anesthesia Quick Evaluation

## 2021-08-16 NOTE — Anesthesia Procedure Notes (Addendum)
Procedure Name: Intubation Date/Time: 08/16/2021 4:15 PM Performed by: Gwenyth Allegra, CRNA Pre-anesthesia Checklist: Patient identified, Emergency Drugs available, Suction available, Patient being monitored and Timeout performed Patient Re-evaluated:Patient Re-evaluated prior to induction Oxygen Delivery Method: Circle system utilized Preoxygenation: Pre-oxygenation with 100% oxygen Induction Type: IV induction and Rapid sequence Laryngoscope Size: Glidescope Grade View: Grade II Tube type: Oral Tube size: 7.5 mm Number of attempts: 1 Airway Equipment and Method: Stylet Placement Confirmation: ETT inserted through vocal cords under direct vision, positive ETCO2 and breath sounds checked- equal and bilateral Secured at: 22 cm Tube secured with: Tape Dental Injury: Teeth and Oropharynx as per pre-operative assessment

## 2021-08-17 NOTE — Progress Notes (Signed)
Pt ambulated with RN in hallway, no assistance required, no distress/pain. Pt eager to discharge home.

## 2021-08-17 NOTE — Progress Notes (Signed)
Discharge instructions given to pt, with pt verbalizing understanding. IV removed, catheter intact. No further questions or needs at this time. Pt transported out to family car by RN.

## 2021-08-17 NOTE — Plan of Care (Signed)
Care plan reviewed with pt and pt's mother

## 2021-08-17 NOTE — Discharge Instructions (Signed)

## 2021-08-17 NOTE — Progress Notes (Signed)
Patient ID: Cole Ferguson, male   DOB: 2001/06/13, 20 y.o.   MRN: [id number] BP 136/73   Pulse 82   Temp 97.8 F (36.6 C) (Oral)   Resp 18   Ht 6\' 3"  (1.905 m)   Wt 76.7 kg   SpO2 99%   BMI 21.14 kg/m  Alert and oriented x 4 Moving all extremities well Wound clean and dry

## 2021-08-17 NOTE — Discharge Summary (Signed)
Physician Discharge Summary  Patient ID: Cole Ferguson MRN: [id number] DOB/AGE: 2001/03/22 20 y.o.  Admit date: 08/16/2021 Discharge date: 08/17/2021  Admission Diagnoses:cervical spondylosis, cervical fracture with stenosis with myelopathy C4  Discharge Diagnoses: same Active Problems:   Myelopathy [facility])   Discharged Condition: good  Hospital Course: Mr. Cobern was admitted and taken to the operating room for an uncomplicated C4 corpectomy and C3-5 arthrodesis. Post op he has ambulated, voided, and tolerated a regular diet. He is doing very well. The wound is clean dry, and without signs of infection. Strength is normal  Treatments: surgery: Anterior cervical corpectomy of C4 with reconstruction utilizing the globus Nico peek cage packed with locally harvested autograft mixed with division and anterior cervical plating utilizing the globus extend plating system with 56mm bicortical screws  Discharge Exam: Blood pressure 134/71, pulse 89, temperature 98.2 F (36.8 C), temperature source Oral, resp. rate 15, height 6\' 3"  (1.905 m), weight 76.7 kg, SpO2 93 %. As above  Disposition: Discharge disposition: 01-Home or Self Care      Cervical fracture  Allergies as of 08/17/2021   No Known Allergies      Medication List     STOP taking these medications    feeding supplement (OSMOLITE 1.5 CAL) Liqd   feeding supplement (PROSource TF) liquid   gabapentin 250 MG/5ML solution Commonly known as: NEURONTIN   naproxen sodium 220 MG tablet Commonly known as: ALEVE   oxyCODONE 5 MG/5ML solution Commonly known as: ROXICODONE   polyethylene glycol 17 g packet Commonly known as: MIRALAX / GLYCOLAX   traMADol 50 MG tablet Commonly known as: ULTRAM       TAKE these medications    methocarbamol 500 MG tablet Commonly known as: ROBAXIN Place 2 tablets (1,000 mg total) into feeding tube every 8 (eight) hours as needed for muscle spasms. What changed:  how much to  take how to take this        Follow-up Information     08/19/2021, MD Follow up.   Specialty: Neurosurgery Why: please call the office to make an appointment Contact information: [address] [place] [phone]                 Signed: [phone] 08/17/2021, 2:31 PM

## 2021-08-18 NOTE — Anesthesia Postprocedure Evaluation (Signed)
Anesthesia Post Note  Patient: Cole Ferguson  Procedure(s) Performed: Corpectomy - Cervical four with anterior cervical plating Cervical three-Cervical five (Spine Cervical)     Anesthesia Post Evaluation No notable events documented.  Last Vitals:  Vitals:   08/17/21 1130 08/17/21 1437  BP: 134/71 135/80  Pulse: 89 87  Resp: 15 16  Temp: 36.8 C 36.7 C  SpO2: 93% 96%    Last Pain:  Vitals:   08/17/21 1437  TempSrc: Oral  PainSc:                  [name]

## 2021-08-19 ENCOUNTER — Encounter (HOSPITAL_COMMUNITY): Payer: Self-pay | Admitting: Neurosurgery

## 2021-08-19 MED FILL — Thrombin For Soln 5000 Unit: CUTANEOUS | Qty: 5000 | Status: AC

## 2021-08-23 ENCOUNTER — Encounter (HOSPITAL_COMMUNITY): Payer: Self-pay | Admitting: Neurosurgery

## 2021-09-26 ENCOUNTER — Other Ambulatory Visit: Payer: Self-pay | Admitting: Neurosurgery

## 2021-09-26 ENCOUNTER — Other Ambulatory Visit (HOSPITAL_COMMUNITY): Payer: Self-pay | Admitting: Neurosurgery

## 2021-09-26 DIAGNOSIS — S12301A Unspecified nondisplaced fracture of fourth cervical vertebra, initial encounter for closed fracture: Secondary | ICD-10-CM

## 2021-10-24 ENCOUNTER — Other Ambulatory Visit: Payer: Self-pay

## 2021-10-24 ENCOUNTER — Ambulatory Visit (HOSPITAL_COMMUNITY)
Admission: RE | Admit: 2021-10-24 | Discharge: 2021-10-24 | Disposition: A | Discharge status: Home / Self Care | Payer: Medicaid Other | Source: Ambulatory Visit | Attending: Neurosurgery | Admitting: Neurosurgery

## 2021-10-24 DIAGNOSIS — S12301A Unspecified nondisplaced fracture of fourth cervical vertebra, initial encounter for closed fracture: Secondary | ICD-10-CM | POA: Insufficient documentation

## 2022-02-23 ENCOUNTER — Encounter (HOSPITAL_COMMUNITY): Payer: Self-pay | Admitting: *Deleted

## 2022-02-23 ENCOUNTER — Emergency Department (HOSPITAL_COMMUNITY)
Admission: EM | Admit: 2022-02-23 | Discharge: 2022-02-24 | Disposition: A | Discharge status: Home / Self Care | Payer: Medicaid Other | Attending: Emergency Medicine | Admitting: Emergency Medicine

## 2022-02-23 ENCOUNTER — Other Ambulatory Visit: Payer: Self-pay

## 2022-02-23 DIAGNOSIS — N132 Hydronephrosis with renal and ureteral calculous obstruction: Secondary | ICD-10-CM | POA: Insufficient documentation

## 2022-02-23 DIAGNOSIS — Q6211 Congenital occlusion of ureteropelvic junction: Secondary | ICD-10-CM

## 2022-02-23 DIAGNOSIS — N451 Epididymitis: Secondary | ICD-10-CM

## 2022-02-23 DIAGNOSIS — R1031 Right lower quadrant pain: Secondary | ICD-10-CM | POA: Diagnosis present

## 2022-02-23 HISTORY — DX: Accidental discharge from unspecified firearms or gun, initial encounter: W34.00XA

## 2022-02-23 LAB — COMPREHENSIVE METABOLIC PANEL
ALT: 16 U/L (ref 0–44)
AST: 16 U/L (ref 15–41)
Albumin: 4.8 g/dL (ref 3.5–5.0)
Alkaline Phosphatase: 65 U/L (ref 38–126)
Anion gap: 9 (ref 5–15)
BUN: 11 mg/dL (ref 6–20)
CO2: 27 mmol/L (ref 22–32)
Calcium: 9.2 mg/dL (ref 8.9–10.3)
Chloride: 102 mmol/L (ref 98–111)
Creatinine, Ser: 1.03 mg/dL (ref 0.61–1.24)
GFR, Estimated: >60 mL/min (ref >60)
Glucose, Bld: 89 mg/dL (ref 70–99)
Potassium: 4 mmol/L (ref 3.5–5.1)
Sodium: 138 mmol/L (ref 135–145)
Total Bilirubin: 1 mg/dL (ref 0.3–1.2)
Total Protein: 7.8 g/dL (ref 6.5–8.1)

## 2022-02-23 LAB — CBC
HCT: 46.4 % (ref 39.0–52.0)
Hemoglobin: 15.2 g/dL (ref 13.0–17.0)
MCH: 28.3 pg (ref 26.0–34.0)
MCHC: 32.8 g/dL (ref 30.0–36.0)
MCV: 86.2 fL (ref 80.0–100.0)
Platelets: 272 10*3/uL (ref 150–400)
RBC: 5.38 MIL/uL (ref 4.22–5.81)
RDW: 14.5 % (ref 11.5–15.5)
WBC: 10.4 10*3/uL (ref 4.0–10.5)
nRBC: 0 % (ref 0.0–0.2)

## 2022-02-23 LAB — LIPASE, BLOOD: Lipase: 25 U/L (ref 11–51)

## 2022-02-23 NOTE — ED Triage Notes (Signed)
Pt with abd pain, had it 2 weeks ago and went away, today came back.  Cramping. +N/V/D states pain is on the right side and radiates down to right testicle.  Denies any burning with urination.  ?

## 2022-02-24 ENCOUNTER — Emergency Department (HOSPITAL_COMMUNITY): Payer: Medicaid Other

## 2022-02-24 LAB — URINALYSIS, ROUTINE W REFLEX MICROSCOPIC
Bacteria, UA: NONE SEEN
Bilirubin Urine: NEGATIVE
Glucose, UA: NEGATIVE mg/dL
Hgb urine dipstick: NEGATIVE
Ketones, ur: 80 mg/dL — AB
Nitrite: NEGATIVE
Protein, ur: 30 mg/dL — AB
Specific Gravity, Urine: 1.025 (ref 1.005–1.030)
WBC, UA: >50 WBC/hpf — ABNORMAL HIGH (ref 0–5)
pH: 7 (ref 5.0–8.0)

## 2022-02-24 MED ORDER — IBUPROFEN 400 MG PO TABS
600.0000 mg | ORAL_TABLET | Freq: Once | ORAL | Status: AC
  Administered 2022-02-24: 600 mg via ORAL
  Filled 2022-02-24: qty 2

## 2022-02-24 MED ORDER — CIPROFLOXACIN HCL 250 MG PO TABS
500.0000 mg | ORAL_TABLET | Freq: Once | ORAL | Status: AC
  Administered 2022-02-24: 500 mg via ORAL
  Filled 2022-02-24: qty 2

## 2022-02-24 MED ORDER — CIPROFLOXACIN HCL 500 MG PO TABS: 500.0000 mg | ORAL_TABLET | Freq: Two times a day (BID) | ORAL | 0 refills | Status: AC

## 2022-02-24 NOTE — Discharge Instructions (Addendum)
Begin taking Cipro as prescribed.  Begin taking ibuprofen 600 mg every 6 hours as needed for pain. ? ?Follow-up with [facility] in the next 2 to 3 days.  Their contact information has been provided in this discharge summary for you to call and make these arrangements. ? ?Return to the emergency department if you develop high fever, worsening pain, or other new and concerning symptoms. ?
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
## 2022-02-27 ENCOUNTER — Other Ambulatory Visit: Payer: Self-pay | Admitting: Urology

## 2022-03-05 NOTE — Patient Instructions (Addendum)
DUE TO COVID-19 ONLY ONE VISITOR  (aged 22 and older)  IS ALLOWED TO COME WITH YOU AND STAY IN THE WAITING ROOM ONLY DURING PRE OP AND PROCEDURE.   ?**NO VISITORS ARE ALLOWED IN THE SHORT STAY AREA OR RECOVERY ROOM!!** ? ? Your procedure is scheduled on: 03/10/22 ? ? Report to [facility] Main Entrance ? ?  Report to admitting at 6:50 AM ? ? Call this number if you have problems the morning of surgery [phone] ? ? Do not eat food :After Midnight. ? ? After Midnight you may have the following liquids until 6:00 AM DAY OF SURGERY ? ?Water ?Black Coffee (sugar ok, NO MILK/CREAM OR CREAMERS)  ?Tea (sugar ok, NO MILK/CREAM OR CREAMERS) regular and decaf                             ?Plain Jell-O (NO RED)                                           ?Fruit ices (not with fruit pulp, NO RED)                                     ?Popsicles (NO RED)                                                                  ?Juice: apple, WHITE grape, WHITE cranberry ?Sports drinks like Gatorade (NO RED) ?Clear broth(vegetable,chicken,beef) ? ?FOLLOW BOWEL PREP AND ANY ADDITIONAL PRE OP INSTRUCTIONS YOU RECEIVED FROM YOUR SURGEON'S OFFICE!!! ?  ?  ?Oral Hygiene is also important to reduce your risk of infection.                                    ?Remember - BRUSH YOUR TEETH THE MORNING OF SURGERY WITH YOUR REGULAR TOOTHPASTE ? ? Do NOT smoke after Midnight ? ? Take these medicines the morning of surgery with A SIP OF WATER: None ?                  ?           You may not have any metal on your body including  jewelry, and body piercing ? ?           Do not wear lotions, powders, cologne, or deodorant ? ?            Men may shave face and neck. ? ? Do not bring valuables to the hospital. [facility] NOT ?            RESPONSIBLE   FOR VALUABLES. ?  ? Patients discharged on the day of surgery will not be allowed to drive home.  Someone NEEDS to stay with you for the first 24 hours after anesthesia. ? ?            Please read  over the following fact sheets you were given: IF YOU  HAVE QUESTIONS ABOUT YOUR PRE-OP INSTRUCTIONS PLEASE CALL [phone]- Apolonio Schneiders ? ?   [facility] [place] - Preparing for Surgery ?Before surgery, you can play an important role.  Because skin is not sterile, your skin needs to be as free of germs as possible.  You can reduce the number of germs on your skin by washing with CHG (chlorahexidine gluconate) soap before surgery.  CHG is an antiseptic cleaner which kills germs and bonds with the skin to continue killing germs even after washing. ?Please DO NOT use if you have an allergy to CHG or antibacterial soaps.  If your skin becomes reddened/irritated stop using the CHG and inform your nurse when you arrive at Short Stay. ?Do not shave (including legs and underarms) for at least 48 hours prior to the first CHG shower.  You may shave your face/neck. ? ?Please follow these instructions carefully: ? 1.  Shower with CHG Soap the night before surgery and the  morning of surgery. ? 2.  If you choose to wash your hair, wash your hair first as usual with your normal  shampoo. ? 3.  After you shampoo, rinse your hair and body thoroughly to remove the shampoo.                            ? 4.  Use CHG as you would any other liquid soap.  You can apply chg directly to the skin and wash.  Gently with a scrungie or clean washcloth. ? 5.  Apply the CHG Soap to your body ONLY FROM THE NECK DOWN.   Do   not use on face/ open      ?                     Wound or open sores. Avoid contact with eyes, ears mouth and   genitals (private parts).  ?                     Production manager,  Genitals (private parts) with your normal soap. ?            6.  Wash thoroughly, paying special attention to the area where your    surgery  will be performed. ? 7.  Thoroughly rinse your body with warm water from the neck down. ? 8.  DO NOT shower/wash with your normal soap after using and rinsing off the CHG Soap. ?               9.  Pat yourself dry with a clean  towel. ?           10.  Wear clean pajamas. ?           11.  Place clean sheets on your bed the night of your first shower and do not  sleep with pets. ?Day of Surgery : ?Do not apply any lotions/deodorants the morning of surgery.  Please wear clean clothes to the hospital/surgery center. ? ?FAILURE TO FOLLOW THESE INSTRUCTIONS MAY RESULT IN THE CANCELLATION OF YOUR SURGERY ? ?PATIENT SIGNATURE_________________________________ ? ?NURSE SIGNATURE__________________________________ ? ?________________________________________________________________________  ?

## 2022-03-05 NOTE — Progress Notes (Addendum)
COVID Vaccine Completed: no ?Date COVID Vaccine completed: ?Has received booster: ?COVID vaccine manufacturer: [place]  ? ?Date of COVID positive in last 90 days: no ? ?[facility] ?Cardiologist - n/a ? ?Chest x-ray - 05/27/21 Epic ?EKG - n/a ?Stress Test - n/a ?ECHO - n/a ?Cardiac Cath - n/a ?Pacemaker/ICD device last checked: n/a ?Spinal Cord Stimulator: n/a ? ?Bowel Prep - no ? ?Sleep Study - n/a ?CPAP -  ? ?Fasting Blood Sugar - n/a ?Checks Blood Sugar _____ times a day ? ?Blood Thinner Instructions: n/a ?Aspirin Instructions: ?Last Dose: ? ?Activity level: Can go up a flight of stairs and perform activities of daily living without stopping and without symptoms of chest pain or shortness of breath. ?    ? ?Anesthesia review:  ? ?Patient denies shortness of breath, fever, cough and chest pain at PAT appointment ? ? ?Patient verbalized understanding of instructions that were given to them at the PAT appointment. Patient was also instructed that they will need to review over the PAT instructions again at home before surgery.  ?

## 2022-03-06 ENCOUNTER — Encounter (HOSPITAL_COMMUNITY)
Admission: RE | Admit: 2022-03-06 | Discharge: 2022-03-06 | Disposition: A | Discharge status: Home / Self Care | Payer: Medicaid Other | Source: Ambulatory Visit | Attending: Urology | Admitting: Urology

## 2022-03-06 ENCOUNTER — Other Ambulatory Visit: Payer: Self-pay

## 2022-03-06 ENCOUNTER — Encounter (HOSPITAL_COMMUNITY): Payer: Self-pay

## 2022-03-06 DIAGNOSIS — Z01812 Encounter for preprocedural laboratory examination: Secondary | ICD-10-CM | POA: Diagnosis present

## 2022-03-10 ENCOUNTER — Ambulatory Visit (HOSPITAL_COMMUNITY)
Admission: RE | Admit: 2022-03-10 | Discharge: 2022-03-10 | Disposition: A | Discharge status: Home / Self Care | Payer: Medicaid Other | Source: Ambulatory Visit | Attending: Urology | Admitting: Urology

## 2022-03-10 ENCOUNTER — Ambulatory Visit (HOSPITAL_COMMUNITY): Payer: Medicaid Other | Admitting: Anesthesiology

## 2022-03-10 ENCOUNTER — Ambulatory Visit (HOSPITAL_BASED_OUTPATIENT_CLINIC_OR_DEPARTMENT_OTHER): Payer: Medicaid Other | Admitting: Anesthesiology

## 2022-03-10 ENCOUNTER — Ambulatory Visit (HOSPITAL_COMMUNITY): Payer: Medicaid Other

## 2022-03-10 ENCOUNTER — Encounter (HOSPITAL_COMMUNITY): Payer: Self-pay | Admitting: Urology

## 2022-03-10 ENCOUNTER — Encounter (HOSPITAL_COMMUNITY)
Admission: RE | Disposition: A | Discharge status: Home / Self Care | Payer: Self-pay | Source: Ambulatory Visit | Attending: Urology

## 2022-03-10 DIAGNOSIS — N202 Calculus of kidney with calculus of ureter: Secondary | ICD-10-CM | POA: Diagnosis not present

## 2022-03-10 DIAGNOSIS — F909 Attention-deficit hyperactivity disorder, unspecified type: Secondary | ICD-10-CM

## 2022-03-10 DIAGNOSIS — N2889 Other specified disorders of kidney and ureter: Secondary | ICD-10-CM | POA: Diagnosis present

## 2022-03-10 DIAGNOSIS — N132 Hydronephrosis with renal and ureteral calculous obstruction: Secondary | ICD-10-CM | POA: Diagnosis not present

## 2022-03-10 DIAGNOSIS — F17200 Nicotine dependence, unspecified, uncomplicated: Secondary | ICD-10-CM | POA: Diagnosis not present

## 2022-03-10 HISTORY — PX: CYSTOSCOPY/URETEROSCOPY/HOLMIUM LASER/STENT PLACEMENT: SHX6546

## 2022-03-10 SURGERY — CYSTOSCOPY/URETEROSCOPY/HOLMIUM LASER/STENT PLACEMENT
Anesthesia: General | Laterality: Right

## 2022-03-10 MED ORDER — KETOROLAC TROMETHAMINE 30 MG/ML IJ SOLN
INTRAMUSCULAR | Status: DC | PRN
  Administered 2022-03-10: 30 mg via INTRAVENOUS

## 2022-03-10 MED ORDER — FENTANYL CITRATE (PF) 100 MCG/2ML IJ SOLN
INTRAMUSCULAR | Status: DC | PRN
  Administered 2022-03-10 (×2): 50 ug via INTRAVENOUS

## 2022-03-10 MED ORDER — 0.9 % SODIUM CHLORIDE (POUR BTL) OPTIME
TOPICAL | Status: DC | PRN
  Administered 2022-03-10: 1000 mL

## 2022-03-10 MED ORDER — AMISULPRIDE (ANTIEMETIC) 5 MG/2ML IV SOLN: 10.0000 mg | Freq: Once | INTRAVENOUS | Status: DC | PRN

## 2022-03-10 MED ORDER — IOHEXOL 300 MG/ML  SOLN
INTRAMUSCULAR | Status: DC | PRN
  Administered 2022-03-10: 9 mL

## 2022-03-10 MED ORDER — PROPOFOL 10 MG/ML IV BOLUS
INTRAVENOUS | Status: AC
  Filled 2022-03-10: qty 20

## 2022-03-10 MED ORDER — DEXMEDETOMIDINE (PRECEDEX) IN NS 20 MCG/5ML (4 MCG/ML) IV SYRINGE
PREFILLED_SYRINGE | INTRAVENOUS | Status: DC | PRN
  Administered 2022-03-10: 12 ug via INTRAVENOUS
  Administered 2022-03-10: 8 ug via INTRAVENOUS

## 2022-03-10 MED ORDER — LACTATED RINGERS IV SOLN: INTRAVENOUS | Status: DC

## 2022-03-10 MED ORDER — CHLORHEXIDINE GLUCONATE 0.12 % MT SOLN
15.0000 mL | Freq: Once | OROMUCOSAL | Status: AC
  Administered 2022-03-10: 15 mL via OROMUCOSAL

## 2022-03-10 MED ORDER — KETOROLAC TROMETHAMINE 30 MG/ML IJ SOLN
INTRAMUSCULAR | Status: AC
  Filled 2022-03-10: qty 1

## 2022-03-10 MED ORDER — OXYCODONE HCL 5 MG PO TABS: 5.0000 mg | ORAL_TABLET | ORAL | 0 refills | Status: AC | PRN

## 2022-03-10 MED ORDER — CEFAZOLIN SODIUM-DEXTROSE 2-4 GM/100ML-% IV SOLN
2.0000 g | INTRAVENOUS | Status: AC
  Administered 2022-03-10: 2 g via INTRAVENOUS
  Filled 2022-03-10: qty 100

## 2022-03-10 MED ORDER — MIDAZOLAM HCL 5 MG/5ML IJ SOLN
INTRAMUSCULAR | Status: DC | PRN
  Administered 2022-03-10: 2 mg via INTRAVENOUS

## 2022-03-10 MED ORDER — FENTANYL CITRATE PF 50 MCG/ML IJ SOSY: 25.0000 ug | PREFILLED_SYRINGE | INTRAMUSCULAR | Status: DC | PRN

## 2022-03-10 MED ORDER — FENTANYL CITRATE (PF) 100 MCG/2ML IJ SOLN
INTRAMUSCULAR | Status: AC
  Filled 2022-03-10: qty 2

## 2022-03-10 MED ORDER — MIDAZOLAM HCL 2 MG/2ML IJ SOLN
INTRAMUSCULAR | Status: AC
  Filled 2022-03-10: qty 2

## 2022-03-10 MED ORDER — ORAL CARE MOUTH RINSE: 15.0000 mL | Freq: Once | OROMUCOSAL | Status: AC

## 2022-03-10 MED ORDER — PHENYLEPHRINE 40 MCG/ML (10ML) SYRINGE FOR IV PUSH (FOR BLOOD PRESSURE SUPPORT)
PREFILLED_SYRINGE | INTRAVENOUS | Status: AC
  Filled 2022-03-10: qty 10

## 2022-03-10 MED ORDER — ACETAMINOPHEN 500 MG PO TABS: 1000.0000 mg | ORAL_TABLET | Freq: Once | ORAL | Status: DC

## 2022-03-10 MED ORDER — EPHEDRINE 5 MG/ML INJ
INTRAVENOUS | Status: AC
  Filled 2022-03-10: qty 5

## 2022-03-10 MED ORDER — DEXAMETHASONE SODIUM PHOSPHATE 10 MG/ML IJ SOLN
INTRAMUSCULAR | Status: DC | PRN
  Administered 2022-03-10: 10 mg via INTRAVENOUS

## 2022-03-10 MED ORDER — PROPOFOL 10 MG/ML IV BOLUS
INTRAVENOUS | Status: DC | PRN
  Administered 2022-03-10: 200 mg via INTRAVENOUS

## 2022-03-10 MED ORDER — LIDOCAINE 2% (20 MG/ML) 5 ML SYRINGE
INTRAMUSCULAR | Status: DC | PRN
Start: 2022-03-10 — End: 2022-03-10
  Administered 2022-03-10: 60 mg via INTRAVENOUS
  Administered 2022-03-10: 40 mg via INTRAVENOUS

## 2022-03-10 MED ORDER — SODIUM CHLORIDE 0.9 % IR SOLN
Status: DC | PRN
Start: 2022-03-10 — End: 2022-03-10
  Administered 2022-03-10: 6000 mL

## 2022-03-10 MED ORDER — ONDANSETRON HCL 4 MG/2ML IJ SOLN
INTRAMUSCULAR | Status: DC | PRN
  Administered 2022-03-10: 4 mg via INTRAVENOUS

## 2022-03-10 SURGICAL SUPPLY — 24 items
BAG URO CATCHER STRL LF (MISCELLANEOUS) ×2 IMPLANT
BASKET LASER NITINOL 1.9FR (BASKET) IMPLANT
BASKET ZERO TIP NITINOL 2.4FR (BASKET) ×1 IMPLANT
CATH URETL OPEN END 6FR 70 (CATHETERS) ×2 IMPLANT
CLOTH BEACON ORANGE TIMEOUT ST (SAFETY) ×2 IMPLANT
ELECT HOOK LOOP BIPOLAR (NEEDLE) ×1 IMPLANT
EXTRACTOR STONE 1.7FRX115CM (UROLOGICAL SUPPLIES) IMPLANT
GLOVE SURG ENC MOIS LTX SZ7.5 (GLOVE) ×2 IMPLANT
GOWN STRL REUS W/ TWL XL LVL3 (GOWN DISPOSABLE) ×1 IMPLANT
GOWN STRL REUS W/TWL XL LVL3 (GOWN DISPOSABLE) ×2
GUIDEWIRE ANG ZIPWIRE 038X150 (WIRE) IMPLANT
GUIDEWIRE STR DUAL SENSOR (WIRE) ×3 IMPLANT
KIT TURNOVER KIT A (KITS) IMPLANT
LASER FIB FLEXIVA PULSE ID 365 (Laser) IMPLANT
MANIFOLD NEPTUNE II (INSTRUMENTS) ×2 IMPLANT
PACK CYSTO (CUSTOM PROCEDURE TRAY) ×2 IMPLANT
SHEATH NAVIGATOR HD 11/13X28 (SHEATH) IMPLANT
SHEATH NAVIGATOR HD 11/13X36 (SHEATH) IMPLANT
SHEATH NAVIGATOR HD 12/14X46 (SHEATH) ×1 IMPLANT
STENT URET 6FRX26 CONTOUR (STENTS) ×1 IMPLANT
TRACTIP FLEXIVA PULS ID 200XHI (Laser) IMPLANT
TRACTIP FLEXIVA PULSE ID 200 (Laser)
TUBING CONNECTING 10 (TUBING) ×2 IMPLANT
TUBING UROLOGY SET (TUBING) ×2 IMPLANT

## 2022-03-10 NOTE — Transfer of Care (Signed)
Immediate Anesthesia Transfer of Care Note ? ?Patient: Cole Ferguson ? ?Procedure(s) Performed: CYSTOSCOPY/URETEROSCOPY/STENT PLACEMENT RIGHT URETEROCELE INCISION (Right) ? ?Patient Location: PACU ? ?Anesthesia Type:General ? ?Level of Consciousness: awake, alert  and oriented ? ?Airway & Oxygen Therapy: Patient Spontanous Breathing and Patient connected to face mask oxygen ? ?Post-op Assessment: Report given to RN and Post -op Vital signs reviewed and stable ? ?Post vital signs: Reviewed and stable ? ?Last Vitals:  ?Vitals Value Taken Time  ?BP 148/99 03/10/22 1005  ?Temp    ?Pulse 62 03/10/22 1007  ?Resp 19 03/10/22 1007  ?SpO2 100 % 03/10/22 1007  ?Vitals shown include unvalidated device data. ? ?Last Pain:  ?Vitals:  ? 03/10/22 0732  ?TempSrc:   ?PainSc: 0-No pain  ?   ? ?  ? ?Complications: No notable events documented. ?

## 2022-03-10 NOTE — Anesthesia Postprocedure Evaluation (Signed)
Anesthesia Post Note ? ?Patient: Cole Ferguson ? ?Procedure(s) Performed: CYSTOSCOPY/URETEROSCOPY/STENT PLACEMENT RIGHT URETEROCELE INCISION (Right) ? ?  ? ?Patient location during evaluation: PACU ?Anesthesia Type: General ?Level of consciousness: awake and alert ?Pain management: pain level controlled ?Vital Signs Assessment: post-procedure vital signs reviewed and stable ?Respiratory status: spontaneous breathing, nonlabored ventilation, respiratory function stable and patient connected to nasal cannula oxygen ?Cardiovascular status: blood pressure returned to baseline and stable ?Postop Assessment: no apparent nausea or vomiting ?Anesthetic complications: no ? ? ?No notable events documented. ? ?Last Vitals:  ?Vitals:  ? 03/10/22 1029 03/10/22 1101  ?BP: 126/87 (!) 134/97  ?Pulse: (!) 59 (!) 55  ?Resp: 16 15  ?Temp: 36.6 ?C 36.6 ?C  ?SpO2: 100% 100%  ?  ?Last Pain:  ?Vitals:  ? 03/10/22 1101  ?TempSrc: Oral  ?PainSc: 2   ? ? ?  ?  ?  ?  ?  ?  ? ?Marcene Duos E ? ? ? ? ?

## 2022-03-10 NOTE — Op Note (Signed)
Operative Note ? ?Preoperative diagnosis:  ?1.  Right ureterocele ?2.  Renal and ureteral calculi on the right ? ?Postoperative diagnosis: ?Same ? ?Procedure(s): ?1.  Transurethral incision of ureterocele on the right ?2.  Right retrograde pyelogram with diagnostic ureteroscopy ? ?Surgeon: Modena Slater, MD ? ?Assistants: None ? ?Anesthesia: General ? ?Complications: None immediate ? ?EBL: Minimal ? ?Specimens: ?1.  None ? ?Drains/Catheters: ?1.  6 x 26 double-J ureteral stent ? ?Intraoperative findings: 1.  Normal urethra and bladder 2.  Right ureterocele that was incised and opened. ?3.  Right retrograde pyelogram revealed severe hydroureteronephrosis.  Diagnostic ureteroscopy revealed multiple tiny stone fragments in the distal ureter and also tiny fragments within the kidney but none large enough to fragment or laser. ? ?Indication: 21 year old male with right-sided ureterocele and ureteral/renal calculi presents for the previously mentioned operation. ? ?Description of procedure: ? ?The patient was identified and consent was obtained.  The patient was taken to the operating room and placed in the supine position.  The patient was placed under general anesthesia.  Perioperative antibiotics were administered.  The patient was placed in dorsal lithotomy.  Patient was prepped and draped in a standard sterile fashion and a timeout was performed. ? ?A 21 French rigid cystoscope was advanced into the urethra and into the bladder.  Complete cystoscopy was performed with findings noted above.  A sensor wire was advanced into the right ureter and up to the kidney under fluoroscopic guidance.  There was an obvious ureterocele emanating into the bladder.  I withdrew the scope and secured the wire to the drape as a safety wire.  I advanced a 19 French resectoscope with a visual obturator in place into the urethra and into the bladder.  I exchanged this for the bipolar working element.  With the incisional element, I incised  the ureterocele anteriorly on top of the wire and opened the ureterocele.  There was no active bleeding after transurethral incision.  I withdrew the scope and advanced a semirigid ureteroscope alongside the wire and into the distal ureter.  There were multiple tiny stone fragments within the distal ureter but no large stone fragment to treat.  I advanced the scope up the ureter to the proximal ureter and did not identify any stone fragments.  I advanced a second wire through the scope and into the kidney and withdrew the scope.  A 12 x 14 access sheath was advanced over the wire under continuous fluoroscopic guidance up to the proximal ureter.  Inner sheath and wire were withdrawn.  Digital ureteroscopy was performed and multiple tiny calculi were seen in the kidney but no clinically significant stone fragments to basket extract or laser fragment.  I shot a retrograde pyelogram through the scope with severe hydroureteronephrosis.  I withdrew the scope along with the access sheath visualizing the entire ureter upon removal.  There were again some tiny ureteral calculi but no clinically significant stone fragments to treat.  I backloaded the wire onto rigid cystoscope and advanced that into the bladder followed by routine placement of a 6 x 26 double-J ureteral stent.  After removing the wire, the stent migrated up the ureter.  I advanced a semirigid ureteroscope into the urethra and into the bladder and into the distal ureter.  A basket was used to grasp the stent and reposition it so that the curl was within the bladder.  Fluoroscopy confirmed proximal and distal placement.  Direct visualization also confirmed a good coil within the bladder after repositioning.  I withdrew the scope and this concluded the operation.  Patient tolerated the procedure well and was stable postoperative. ? ?Plan: Follow-up in 4 to 6 weeks for stent removal.  He will then need to be scheduled for a Lasix renogram plus renal ultrasound  about 4 to 6 weeks after that. ? ?

## 2022-03-10 NOTE — H&P (Signed)
CC/HPI: CC: Right flank pain, hydronephrosis, renal and ureteral calculi  ?HPI:  ?02/25/2022  ?21 year old male went to the emergency department yesterday for a 2-week history of intermittent right-sided flank pain. He was found to have significant right hydronephrosis and hydroureter down to the level of the ureterovesicular junction calculus. He had findings consistent with a ureterocele as well as several right distal ureteral calculi. He appeared to have some cortical thinning consistent with a chronic process. Denies any significant urological history. Denies any previous abdominal surgeries. He did have some small bilateral nonobstructing calculi as well. No significant pain today. Creatinine was 1.03 with a GFR greater than 60. No leukocytosis. Urinalysis with no bacteria, 6-10 RBC, greater than 50 WBC.  ? ?  ?ALLERGIES: None  ? ?MEDICATIONS: Cipro  ?  ? ?GU PSH: None  ?   ?[facility] Notes: Feeding tube placement- 2022, Trach- 2022, Fusion- 2022  ? ?NON-GU PSH: None  ? ?GU PMH: None  ? ?NON-GU PMH: None  ? ?FAMILY HISTORY: None  ? ?SOCIAL HISTORY: Marital Status: Single ?Preferred Language: English ?Current Smoking Status: Patient smokes. Has smoked since 02/20/2019.  ? ?Tobacco Use Assessment Completed: Used Tobacco in last 30 days? ?Does not use smokeless tobacco. ?Drinks 1 drink per week.  ?Does not use drugs. ?  ? ?REVIEW OF SYSTEMS:    ?GU Review Male:   Patient reports frequent urination, hard to postpone urination, get up at night to urinate, and penile pain. Patient denies burning/ pain with urination, leakage of urine, stream starts and stops, trouble starting your stream, have to strain to urinate , and erection problems.  ?Gastrointestinal (Upper):   Patient reports nausea and vomiting. Patient denies indigestion/ heartburn.  ?Gastrointestinal (Lower):   Patient reports constipation. Patient denies diarrhea.  ?Constitutional:   Patient denies fever, night sweats, weight loss, and fatigue.  ?Skin:   Patient  denies skin rash/ lesion and itching.  ?Eyes:   Patient denies blurred vision and double vision.  ?Ears/ Nose/ Throat:   Patient denies sore throat and sinus problems.  ?Hematologic/Lymphatic:   Patient denies swollen glands and easy bruising.  ?Cardiovascular:   Patient denies leg swelling and chest pains.  ?Respiratory:   Patient denies cough and shortness of breath.  ?Endocrine:   Patient denies excessive thirst.  ?Musculoskeletal:   Patient denies back pain and joint pain.  ?Neurological:   Patient denies headaches and dizziness.  ?Psychologic:   Patient denies depression and anxiety.  ? ?VITAL SIGNS:    ?  02/25/2022 08:46 AM  ?Weight 160 lb / 72.57 kg  ?Height 75 in / 190.5 cm  ?BP 135/86 mmHg  ?Heart Rate 76 /min  ?Temperature 97.3 F / 36.2 C  ?BMI 20.0 kg/m?  ? ?MULTI-SYSTEM PHYSICAL EXAMINATION:    ?Constitutional: Well-nourished. No physical deformities. Normally developed. Good grooming.  ?Respiratory: No labored breathing, no use of accessory muscles.   ?Cardiovascular: Normal temperature, normal extremity pulses, no swelling, no varicosities.  ?Skin: No paleness, no jaundice, no cyanosis. No lesion, no ulcer, no rash.  ?Neurologic / Psychiatric: Oriented to time, oriented to place, oriented to person. No depression, no anxiety, no agitation.  ?Gastrointestinal: No mass, no tenderness, no rigidity, non obese abdomen. No CVA tenderness bilaterally  ?Eyes: Normal conjunctivae. Normal eyelids.  ?Musculoskeletal: Normal gait and station of head and neck.  ? ?  ?Complexity of Data:  ?Source Of History:  Patient  ?Lab Test Review:   BMP  ?Records Review:   Previous Doctor Records, Previous Patient  Records  ?Urine Test Review:   Urinalysis  ?X-Ray Review: C.T. Abdomen/Pelvis: Reviewed Films. Reviewed Report. Discussed With Patient.  ?  ? ?PROCEDURES:    ? ?     Urinalysis - 81003 ?Dipstick Dipstick Cont'd  ?Color: Yellow Bilirubin: Neg  ?Appearance: Clear Ketones: Neg  ?Specific Gravity: 1.025 Blood: Neg  ?pH:  6.0 Protein: Neg  ?Glucose: Neg Urobilinogen: 0.2  ?  Nitrites: Neg  ?  Leukocyte Esterase: Neg  ? ? ?Notes:  ? ?  ? ?ASSESSMENT:  ?    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 ?PLAN:    ? ?      Document ?Letter(s):  Created for Patient: Clinical Summary  ? ? ?     Notes:   Recommend cystoscopy with right ureterocele incision, right ureteroscopy with laser lithotripsy and stone extraction with ureteral stent placement. Risk and benefits discussed including but not limited to bleeding, infection, injury to surrounding structures, need for additional procedures. I also discussed the likely need for a prolonged stent about 4 to 6 weeks. After stent removal, we will get him scheduled for a renal scan with Lasix to evaluate renal function and for any obstruction.  ? ? ?Signed by Link Snuffer, III, M.D. on 02/26/22 at 1:03 PM (EST ?

## 2022-03-10 NOTE — Discharge Instructions (Addendum)

## 2022-03-10 NOTE — Anesthesia Preprocedure Evaluation (Signed)
Anesthesia Evaluation  ?Patient identified by MRN, date of birth, ID band ?Patient awake ? ? ? ?Reviewed: ?Allergy & Precautions, NPO status , Patient's Chart, lab work & pertinent test results ? ?History of Anesthesia Complications ?Negative for: history of anesthetic complications ? ?Airway ?Mallampati: II ? ?TM Distance: >3 FB ?Neck ROM: Limited ? ? ? Dental ? ?(+) Dental Advisory Given ?  ?Pulmonary ? ?Tracheostomy ? ?  ? ?(-) decreased breath sounds ? ? ? ? ? Cardiovascular ?negative cardio ROS ? ? ?Rhythm:Regular Rate:Normal ? ? ?  ?Neuro/Psych ?PSYCHIATRIC DISORDERS   ? GI/Hepatic ?negative GI ROS, (+)  ?  ? substance abuse ? ,   ?Endo/Other  ?negative endocrine ROS ? Renal/GU ?negative Renal ROS  ? ?  ?Musculoskeletal ?negative musculoskeletal ROS ?(+)  ? Abdominal ?  ?Peds ? ?(+) ADHD Hematology ?negative hematology ROS ?(+)   ?Anesthesia Other Findings ? ? Reproductive/Obstetrics ? ?  ? ? ? ? ? ? ? ? ? ? ? ? ? ?  ?  ? ? ? ? ? ? ? ? ?Anesthesia Physical ? ?Anesthesia Plan ? ?ASA: 3 ? ?Anesthesia Plan: General  ? ?Post-op Pain Management: Minimal or no pain anticipated, Tylenol PO (pre-op)* and Toradol IV (intra-op)*  ? ?Induction: Intravenous ? ?PONV Risk Score and Plan: 3 and Ondansetron, Dexamethasone, Treatment may vary due to age or medical condition and Midazolam ? ?Airway Management Planned: LMA ? ?Additional Equipment:  ? ?Intra-op Plan:  ? ?Post-operative Plan: Extubation in OR ? ?Informed Consent: I have reviewed the patients History and Physical, chart, labs and discussed the procedure including the risks, benefits and alternatives for the proposed anesthesia with the patient or authorized representative who has indicated his/her understanding and acceptance.  ? ? ? ?Dental advisory given ? ?Plan Discussed with: Anesthesiologist and CRNA ? ?Anesthesia Plan Comments:   ? ? ? ? ? ? ?Anesthesia Quick Evaluation ? ?

## 2022-03-10 NOTE — Anesthesia Procedure Notes (Signed)
Procedure Name: LMA Insertion ?Date/Time: 03/10/2022 9:00 AM ?Performed by: Rayola Everhart D, CRNA ?Pre-anesthesia Checklist: Patient identified, Emergency Drugs available, Suction available and Patient being monitored ?Patient Re-evaluated:Patient Re-evaluated prior to induction ?Oxygen Delivery Method: Circle system utilized ?Preoxygenation: Pre-oxygenation with 100% oxygen ?Induction Type: IV induction ?Ventilation: Mask ventilation without difficulty ?LMA: LMA inserted ?LMA Size: 4.0 ?Tube type: Oral ?Number of attempts: 1 ?Placement Confirmation: positive ETCO2 and breath sounds checked- equal and bilateral ?Tube secured with: Tape ?Dental Injury: Teeth and Oropharynx as per pre-operative assessment  ? ? ? ? ?

## 2022-03-11 ENCOUNTER — Encounter (HOSPITAL_COMMUNITY): Payer: Self-pay | Admitting: Urology

## 2022-07-17 ENCOUNTER — Other Ambulatory Visit (HOSPITAL_COMMUNITY): Payer: Self-pay | Admitting: Urology

## 2022-07-17 ENCOUNTER — Other Ambulatory Visit: Payer: Self-pay | Admitting: Urology

## 2022-07-17 DIAGNOSIS — Q6231 Congenital ureterocele, orthotopic: Secondary | ICD-10-CM

## 2022-07-17 DIAGNOSIS — N130 Hydronephrosis with ureteropelvic junction obstruction: Secondary | ICD-10-CM

## 2022-08-04 ENCOUNTER — Encounter (HOSPITAL_COMMUNITY)
Admission: RE | Admit: 2022-08-04 | Discharge: 2022-08-04 | Disposition: A | Discharge status: Home / Self Care | Payer: Medicaid Other | Source: Ambulatory Visit | Attending: Urology | Admitting: Urology

## 2022-08-04 DIAGNOSIS — N130 Hydronephrosis with ureteropelvic junction obstruction: Secondary | ICD-10-CM | POA: Diagnosis present

## 2022-08-04 DIAGNOSIS — Q6231 Congenital ureterocele, orthotopic: Secondary | ICD-10-CM | POA: Insufficient documentation

## 2022-08-04 MED ORDER — FUROSEMIDE 10 MG/ML IJ SOLN
37.8000 mg | Freq: Once | INTRAMUSCULAR | Status: AC
  Administered 2022-08-04: 37.8 mg via INTRAVENOUS

## 2022-08-04 MED ORDER — TECHNETIUM TC 99M MERTIATIDE
5.5000 | Freq: Once | INTRAVENOUS | Status: AC
  Administered 2022-08-04: 5.5 via INTRAVENOUS

## 2022-08-04 MED ORDER — FUROSEMIDE 10 MG/ML IJ SOLN
INTRAMUSCULAR | Status: AC
  Filled 2022-08-04: qty 4

## 2023-03-03 ENCOUNTER — Encounter (HOSPITAL_COMMUNITY): Payer: Self-pay | Admitting: Emergency Medicine

## 2023-03-03 ENCOUNTER — Other Ambulatory Visit: Payer: Self-pay

## 2023-03-03 ENCOUNTER — Emergency Department (HOSPITAL_COMMUNITY)
Admission: EM | Admit: 2023-03-03 | Discharge: 2023-03-03 | Disposition: A | Discharge status: Home / Self Care | Payer: Medicaid Other | Attending: Emergency Medicine | Admitting: Emergency Medicine

## 2023-03-03 DIAGNOSIS — Y92410 Unspecified street and highway as the place of occurrence of the external cause: Secondary | ICD-10-CM | POA: Diagnosis not present

## 2023-03-03 DIAGNOSIS — S199XXA Unspecified injury of neck, initial encounter: Secondary | ICD-10-CM | POA: Diagnosis present

## 2023-03-03 DIAGNOSIS — S161XXA Strain of muscle, fascia and tendon at neck level, initial encounter: Secondary | ICD-10-CM | POA: Diagnosis not present

## 2023-03-03 MED ORDER — CYCLOBENZAPRINE HCL 10 MG PO TABS: 10.0000 mg | ORAL_TABLET | Freq: Two times a day (BID) | ORAL | 0 refills | Status: AC | PRN

## 2023-03-03 NOTE — Discharge Instructions (Signed)
Please pick up the muscle relaxer I prescribed for you.  Please take this at night and do not operate machinery or drive as this might make you drowsy.  You may alternate every 6 hours as needed for pain between Tylenol and ibuprofen.  I have also attached a work note for you as well.  Please follow-up with your primary care provider in the next 2 days to be reevaluated as things may change.  If symptoms worsen please return to ER.

## 2023-03-03 NOTE — ED Notes (Signed)
Pt ambulatory to restroom, tolerated well.  
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
## 2023-03-12 IMAGING — CT CT CERVICAL SPINE W/O CM
3 of 4 series · 13 of 33 positions shown, 16 images · non-contrast
Comparison: None.

CLINICAL DATA: C4 corpectomy follow-up

EXAM:
CT CERVICAL SPINE WITHOUT CONTRAST
TECHNIQUE: Multidetector CT imaging of the cervical spine was performed without
intravenous contrast. Multiplanar CT image reconstructions were also
generated.

[Series 5: sag bone · sagittal · 0.28mm/px · 5 of 61 slices shown, 6 images]
[im 21/61  bone]
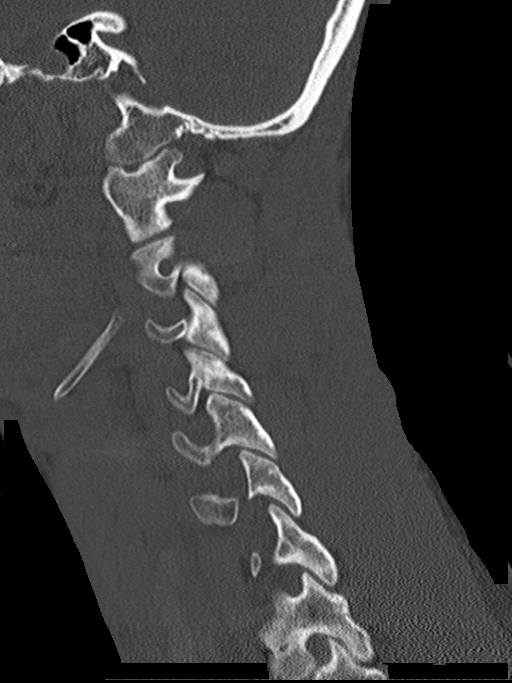
[im 26/61  bone]
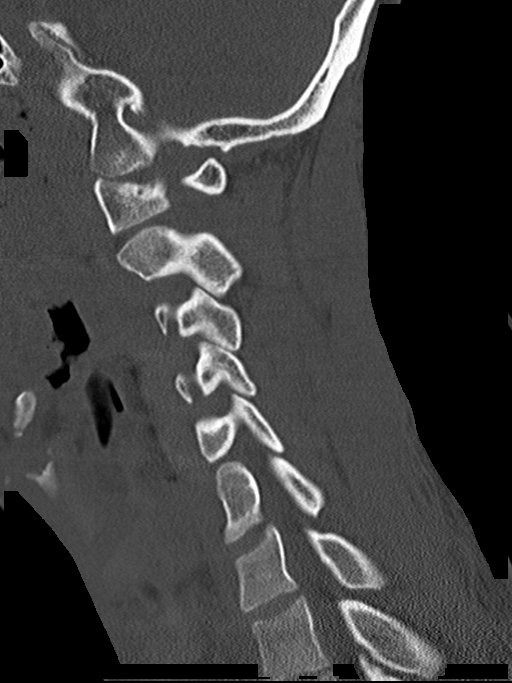
[im 31/61  soft-tissue]
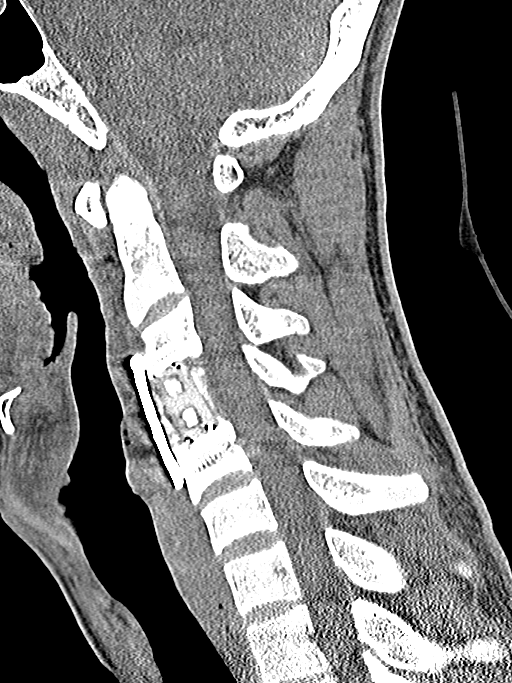
[im 31/61  bone]
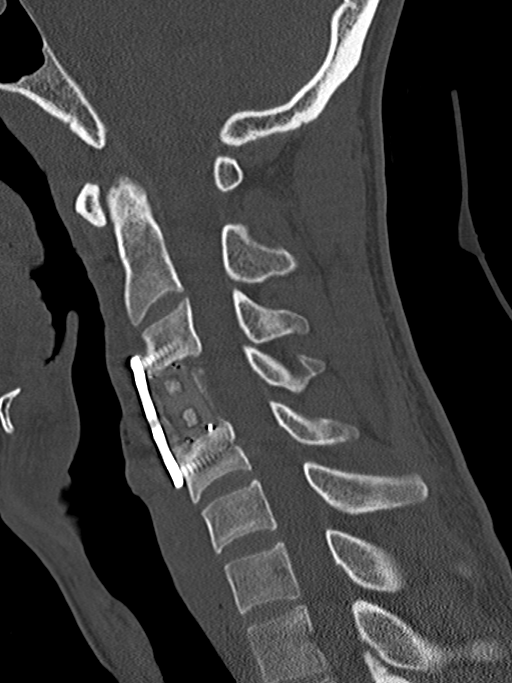
[im 36/61  bone]
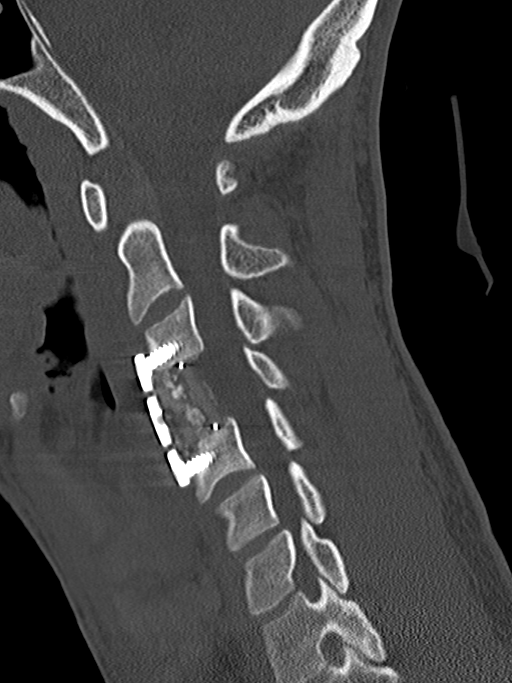
[im 41/61  bone]
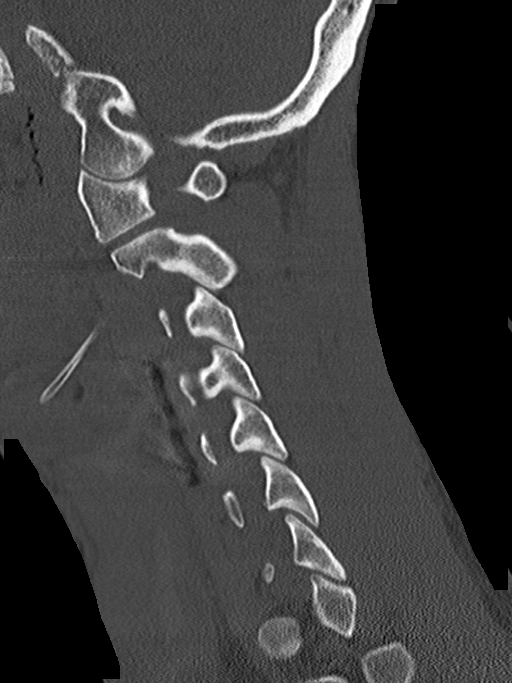

[Series 6: cor bone · coronal · 0.28mm/px · 3 of 61 slices shown]
[im 13/61  bone]
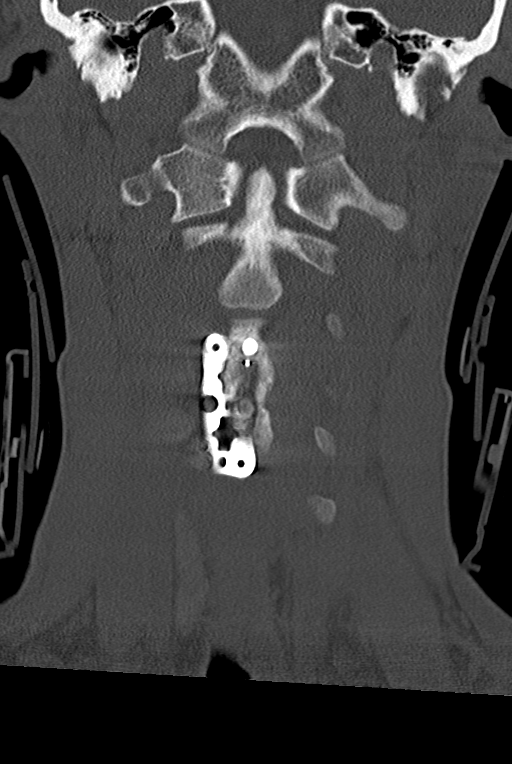
[im 25/61  bone]
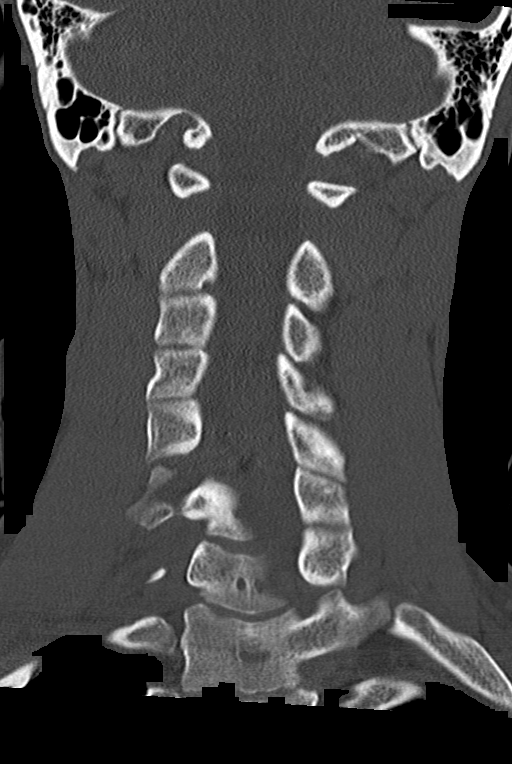
[im 37/61  bone]
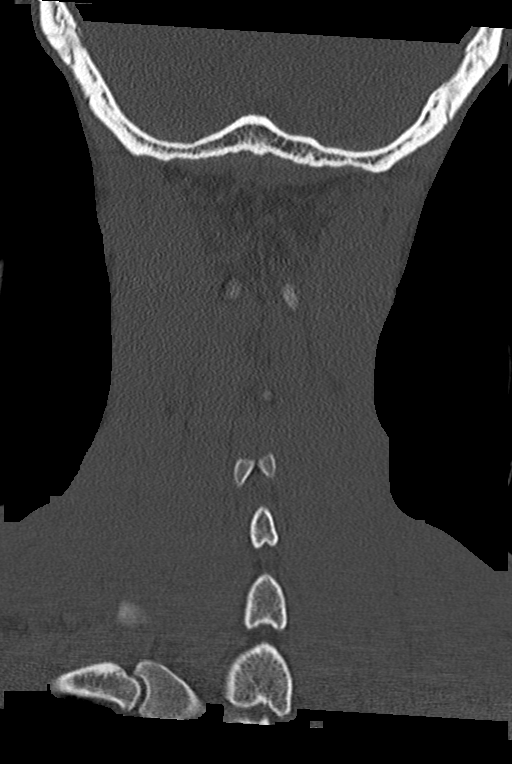

[Series 7: orthogonal axials · axial · 0.21mm/px · z∈[+351,+450]mm · 5 of 85 slices shown, 7 images]
[im 15/85  soft-tissue]
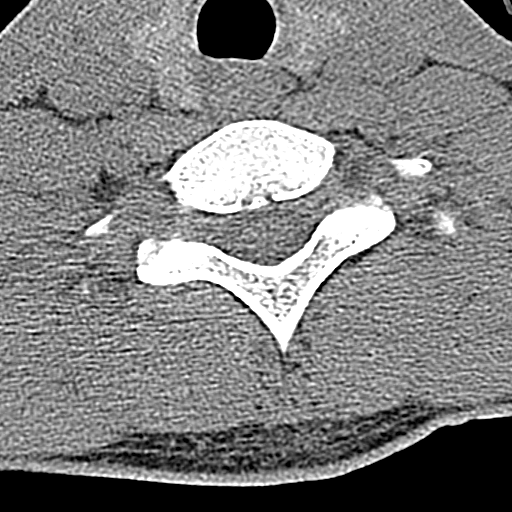
[im 15/85  bone]
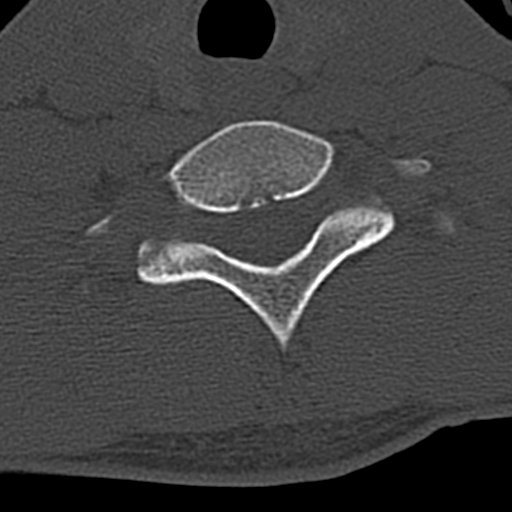
[im 29/85  bone]
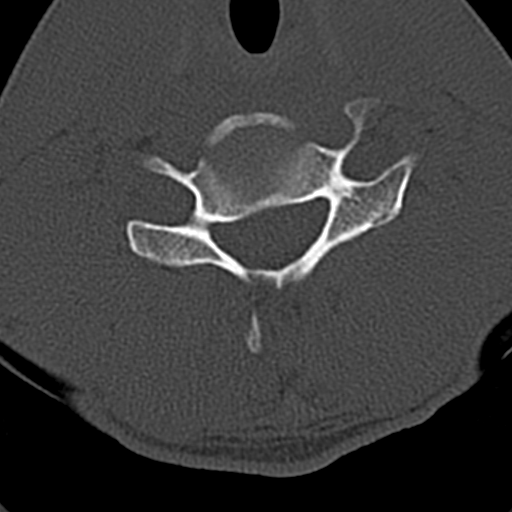
[im 43/85  bone]
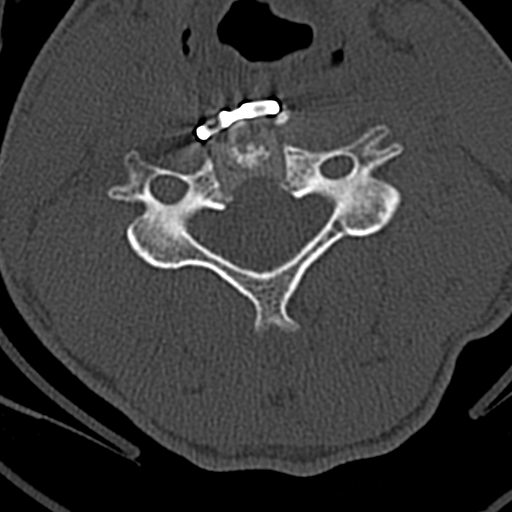
[im 57/85  bone]
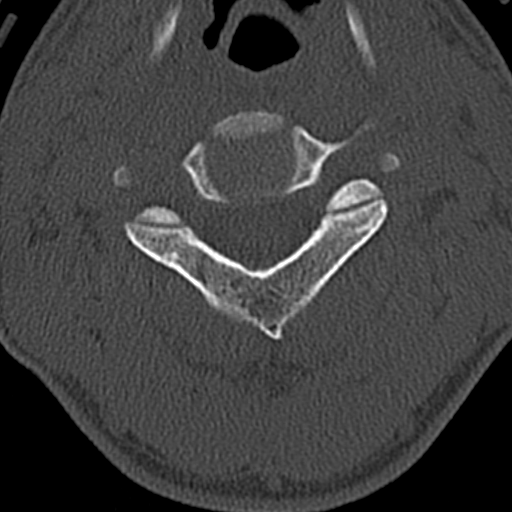
[im 71/85  soft-tissue]
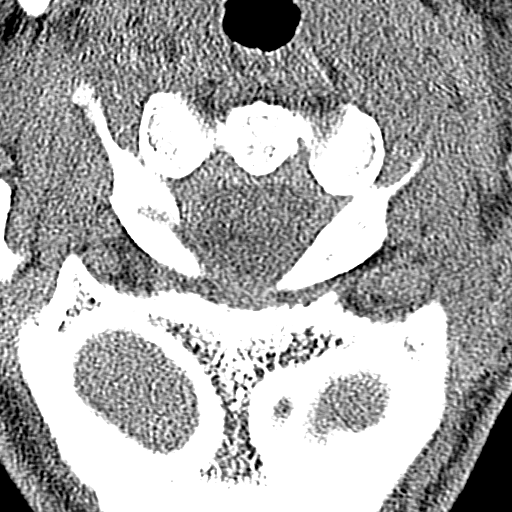
[im 71/85  bone]
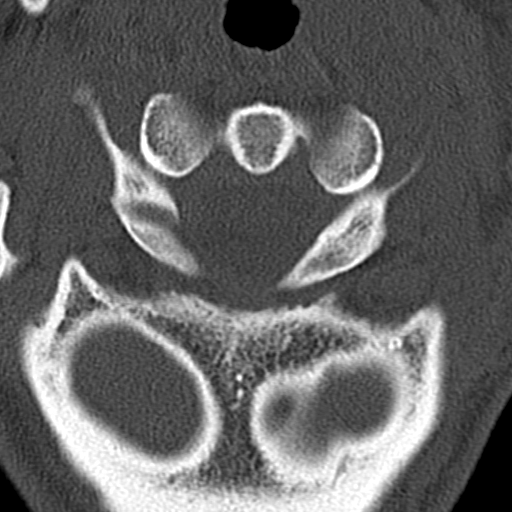

[13 of 33 positions shown; findings below may reference images not displayed]

FINDINGS: Alignment: Normal.

Skull base and vertebrae: Status post C4 corpectomy. Surgical
hardware shows no adverse features. No acute abnormality. The bones
are otherwise normal.

Soft tissues and spinal canal: No prevertebral fluid or swelling. No
visible canal hematoma.

Disc levels:  No spinal canal or neural foraminal stenosis.

Upper chest: Negative.

Other: None.
IMPRESSION: 1. Status post C4 corpectomy without adverse features.
2. No spinal canal or neural foraminal stenosis.

## 2023-07-01 ENCOUNTER — Other Ambulatory Visit: Payer: Self-pay

## 2023-07-01 ENCOUNTER — Encounter (HOSPITAL_COMMUNITY): Payer: Self-pay

## 2023-07-01 ENCOUNTER — Emergency Department (HOSPITAL_COMMUNITY)
Admission: EM | Admit: 2023-07-01 | Discharge: 2023-07-01 | Disposition: A | Discharge status: Home / Self Care | Payer: Medicaid Other | Attending: Pediatric Emergency Medicine | Admitting: Pediatric Emergency Medicine

## 2023-07-01 DIAGNOSIS — J020 Streptococcal pharyngitis: Secondary | ICD-10-CM | POA: Insufficient documentation

## 2023-07-01 LAB — GROUP A STREP BY PCR: Group A Strep by PCR: DETECTED — AB

## 2023-07-01 MED ORDER — AMOXICILLIN 500 MG PO CAPS: 1000.0000 mg | ORAL_CAPSULE | Freq: Every day | ORAL | 0 refills | Status: AC

## 2023-07-01 NOTE — ED Provider Notes (Signed)
[place] EMERGENCY DEPARTMENT AT [facility] Provider Note   CSN: [id number] Arrival date & time: 07/01/23  1718     History  Chief Complaint  Patient presents with   Sore Throat    Cole Ferguson is a 22 y.o. male healthy here with 2 days of sore throat.  Felt warm but no fever checked at home.  Attempting relief with Aleve with some improvement but with persistence presents.  No cough.  No vomiting.   Sore Throat       Home Medications Prior to Admission medications   Medication Sig Start Date End Date Taking? Authorizing Provider  amoxicillin (AMOXIL) 500 MG capsule Take 2 capsules (1,000 mg total) by mouth daily for 10 days. 07/01/23 07/11/23 Yes Clarita Mcelvain, Wyvonnia Dusky, MD  ciprofloxacin (CIPRO) 500 MG tablet Take 1 tablet (500 mg total) by mouth 2 (two) times daily. One po bid x 7 days Patient not taking: Reported on 03/03/2022 02/24/22   [name], MD  cyclobenzaprine (FLEXERIL) 10 MG tablet Take 1 tablet (10 mg total) by mouth 2 (two) times daily as needed for muscle spasms. 03/03/23   Netta Corrigan, PA-C  oxyCODONE (OXY IR/ROXICODONE) 5 MG immediate release tablet Take 1 tablet (5 mg total) by mouth every 4 (four) hours as needed for severe pain. 03/10/22   [name], MD      Allergies    Patient has no known allergies.    Review of Systems   Review of Systems  All other systems reviewed and are negative.   Physical Exam Updated Vital Signs BP (!) 147/86 (BP Location: Right Arm)   Pulse 92   Temp 98.1 F (36.7 C) (Oral)   Resp 18   Ht 6\' 3"  (1.905 m)   Wt 77.1 kg   SpO2 98%   BMI 21.25 kg/m  Physical Exam Vitals and nursing note reviewed.  Constitutional:      Appearance: He is well-developed.  HENT:     Head: Normocephalic and atraumatic.     Mouth/Throat:     Pharynx: Posterior oropharyngeal erythema present.     Tonsils: Tonsillar exudate present. 2+ on the right. 2+ on the left.  Eyes:     Conjunctiva/sclera: Conjunctivae  normal.  Cardiovascular:     Rate and Rhythm: Normal rate and regular rhythm.     Heart sounds: No murmur heard. Pulmonary:     Effort: Pulmonary effort is normal. No respiratory distress.     Breath sounds: Normal breath sounds.  Abdominal:     Palpations: Abdomen is soft.     Tenderness: There is no abdominal tenderness.  Musculoskeletal:     Cervical back: Neck supple.  Lymphadenopathy:     Cervical: Cervical adenopathy present.  Skin:    General: Skin is warm and dry.  Neurological:     Mental Status: He is alert.     ED Results / Procedures / Treatments   Labs (all labs ordered are listed, but only abnormal results are displayed) Labs Reviewed  GROUP A STREP BY PCR - Abnormal; Notable for the following components:      Result Value   Group A Strep by PCR DETECTED (*)    All other components within normal limits    EKG None  Radiology No results found.  Procedures Procedures    Medications Ordered in ED Medications - No data to display  ED Course/ Medical Decision Making/ A&P  Medical Decision Making Amount and/or Complexity of Data Reviewed External Data Reviewed: notes. Labs: ordered. Decision-making details documented in ED Course.  Risk OTC drugs. Prescription drug management.   22 y.o. male with sore throat.  Patient overall well appearing and hydrated on exam.  Doubt meningitis, encephalitis, AOM, mastoiditis, other serious bacterial infection at this time. Exam with symmetric enlarged tonsils and erythematous OP, consistent with acute pharyngitis, viral versus bacterial.  Strep PCR positive will treat with amoxicillin as outpatient.  Recommended symptomatic care with Tylenol or Motrin as needed for sore throat or fevers.  Discouraged use of cough medications. Close follow-up with PCP if not improving.  Return criteria provided for difficulty managing secretions, inability to tolerate p.o., or signs of respiratory  distress.  Caregiver expressed understanding.         Final Clinical Impression(s) / ED Diagnoses Final diagnoses:  Strep throat    Rx / DC Orders ED Discharge Orders          Ordered    amoxicillin (AMOXIL) 500 MG capsule  Daily        07/01/23 2008              Charlett Nose, MD 07/01/23 2251

## 2023-07-01 NOTE — ED Triage Notes (Signed)
Pt c/o sore throat and dysphagiax2d. Pt denies any other sx.
